# Patient Record
Sex: Male | Born: 1955 | Race: Black or African American | Hispanic: No | Marital: Married | Smoking: Never smoker
Health system: Southern US, Community
[De-identification: ages and names within clinical notes are randomized; demographics above are authoritative.]

## PROBLEM LIST (undated history)

## (undated) DIAGNOSIS — M1990 Unspecified osteoarthritis, unspecified site: Secondary | ICD-10-CM

## (undated) DIAGNOSIS — I10 Essential (primary) hypertension: Secondary | ICD-10-CM

## (undated) DIAGNOSIS — E785 Hyperlipidemia, unspecified: Secondary | ICD-10-CM

## (undated) HISTORY — DX: Hyperlipidemia, unspecified: E78.5

## (undated) HISTORY — PX: OTHER SURGICAL HISTORY: SHX169

## (undated) HISTORY — DX: Unspecified osteoarthritis, unspecified site: M19.90

---

## 1999-08-12 ENCOUNTER — Ambulatory Visit (HOSPITAL_COMMUNITY): Admission: RE | Admit: 1999-08-12 | Discharge: 1999-08-12 | Payer: Self-pay | Admitting: Emergency Medicine

## 1999-08-12 ENCOUNTER — Encounter: Payer: Self-pay | Admitting: Emergency Medicine

## 2002-05-08 ENCOUNTER — Encounter: Admission: RE | Admit: 2002-05-08 | Discharge: 2002-05-08 | Payer: Self-pay | Admitting: Emergency Medicine

## 2002-05-08 ENCOUNTER — Encounter: Payer: Self-pay | Admitting: Emergency Medicine

## 2005-11-25 ENCOUNTER — Encounter: Admission: RE | Admit: 2005-11-25 | Discharge: 2005-11-25 | Payer: Self-pay | Admitting: Emergency Medicine
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
## 2008-05-12 ENCOUNTER — Encounter: Admission: RE | Admit: 2008-05-12 | Discharge: 2008-05-12 | Payer: Self-pay | Admitting: Occupational Medicine

## 2008-05-12 IMAGING — CR DG OS CALCIS 2+V*R*
2 series · 2 of 2 positions shown · non-contrast
Comparison: Foot films done same day.

CLINICAL DATA: Right foot and heel pain.  Achilles tendonitis.

RIGHT OS CALCIS - 2+ VIEW

[view not recorded (1 of 2)]
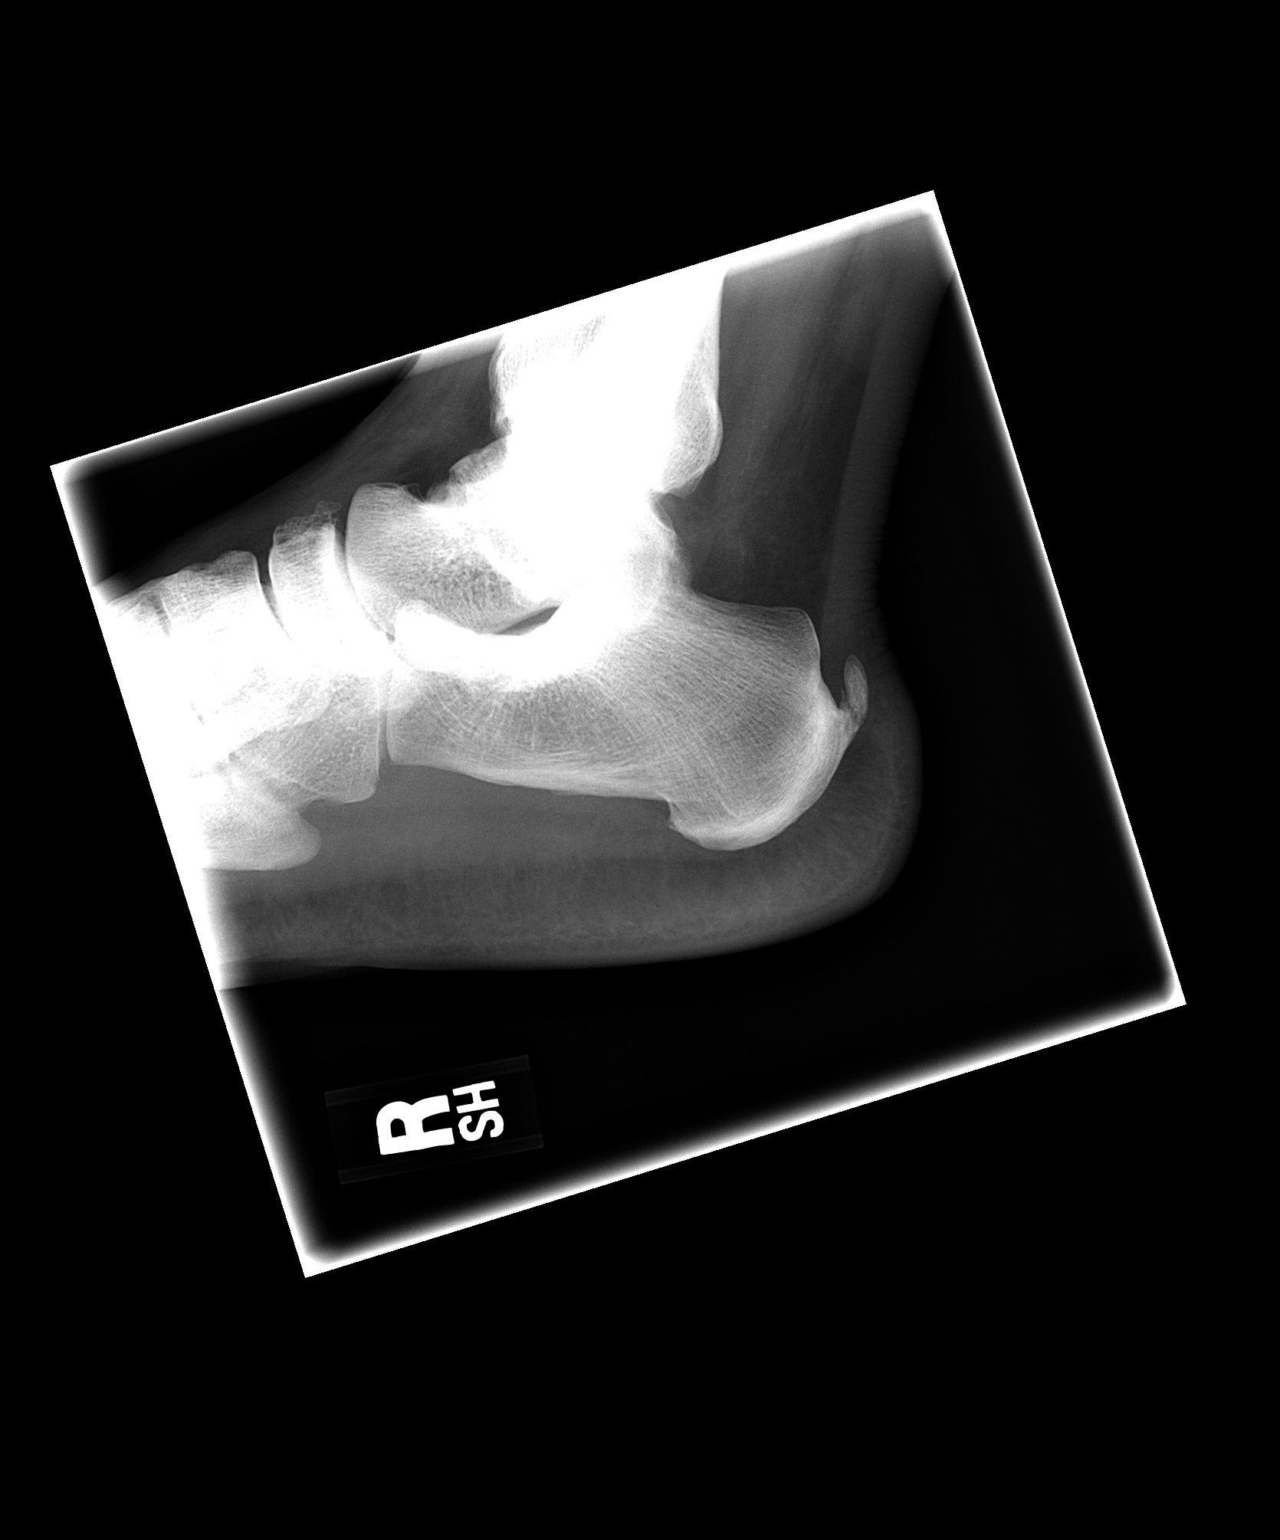

[view not recorded (2 of 2)]
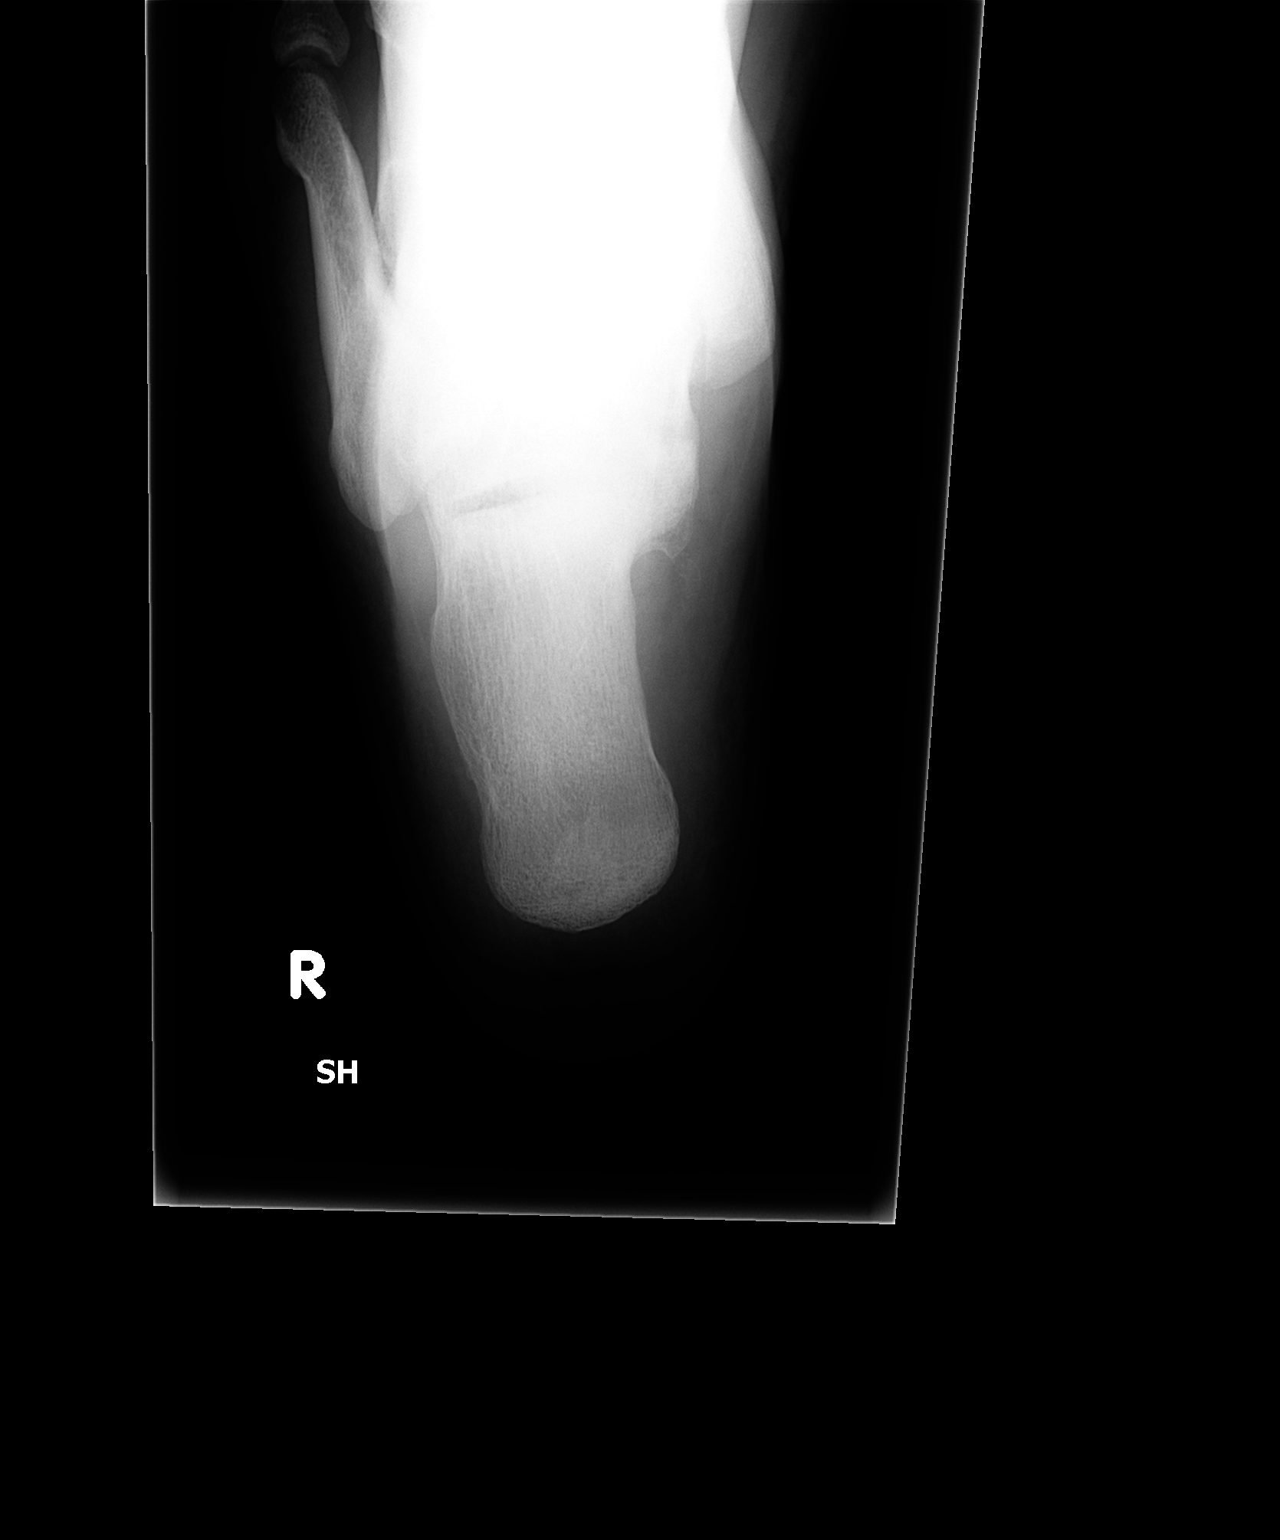

[2 of 2 positions shown; findings below may reference images not displayed]

FINDINGS: Calcaneus is intact.  No fracture is identified.  Tiny
plantar spur is present.  Moderate sized Achilles spur is present.
Faint lucency is present at the base with some very mild overlying
soft tissue swelling, but no definite fracture of enthesophyte.
Kagar fat pad appears within normal limits.
IMPRESSION: Achilles enthesopathy with subtle overlying soft tissue swelling.
Question acute enthesitis.

## 2008-05-12 IMAGING — CR DG FOOT COMPLETE 3+V*R*
3 series · 3 of 3 positions shown · non-contrast
Comparison: None available

CLINICAL DATA: Right foot pain.  Achilles tendonitis.

RIGHT FOOT COMPLETE - 3+ VIEW

[view not recorded (1 of 3)]
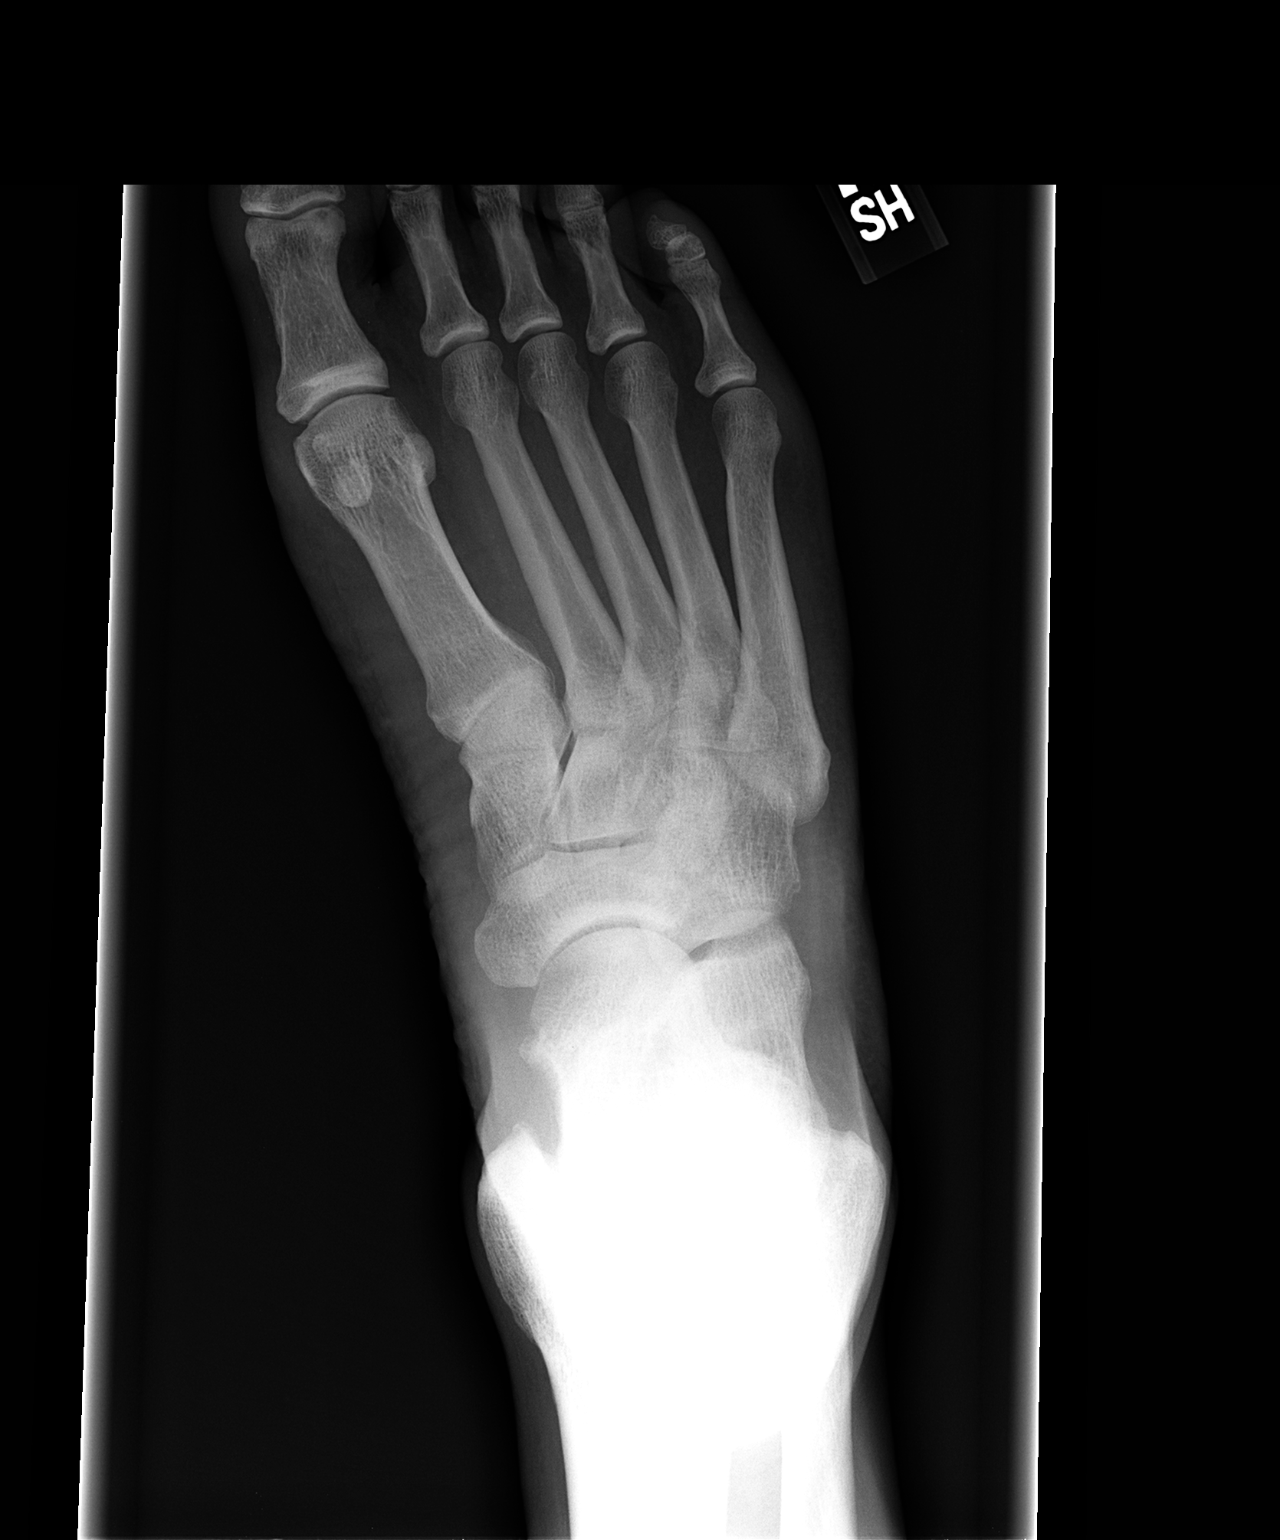

[view not recorded (2 of 3)]
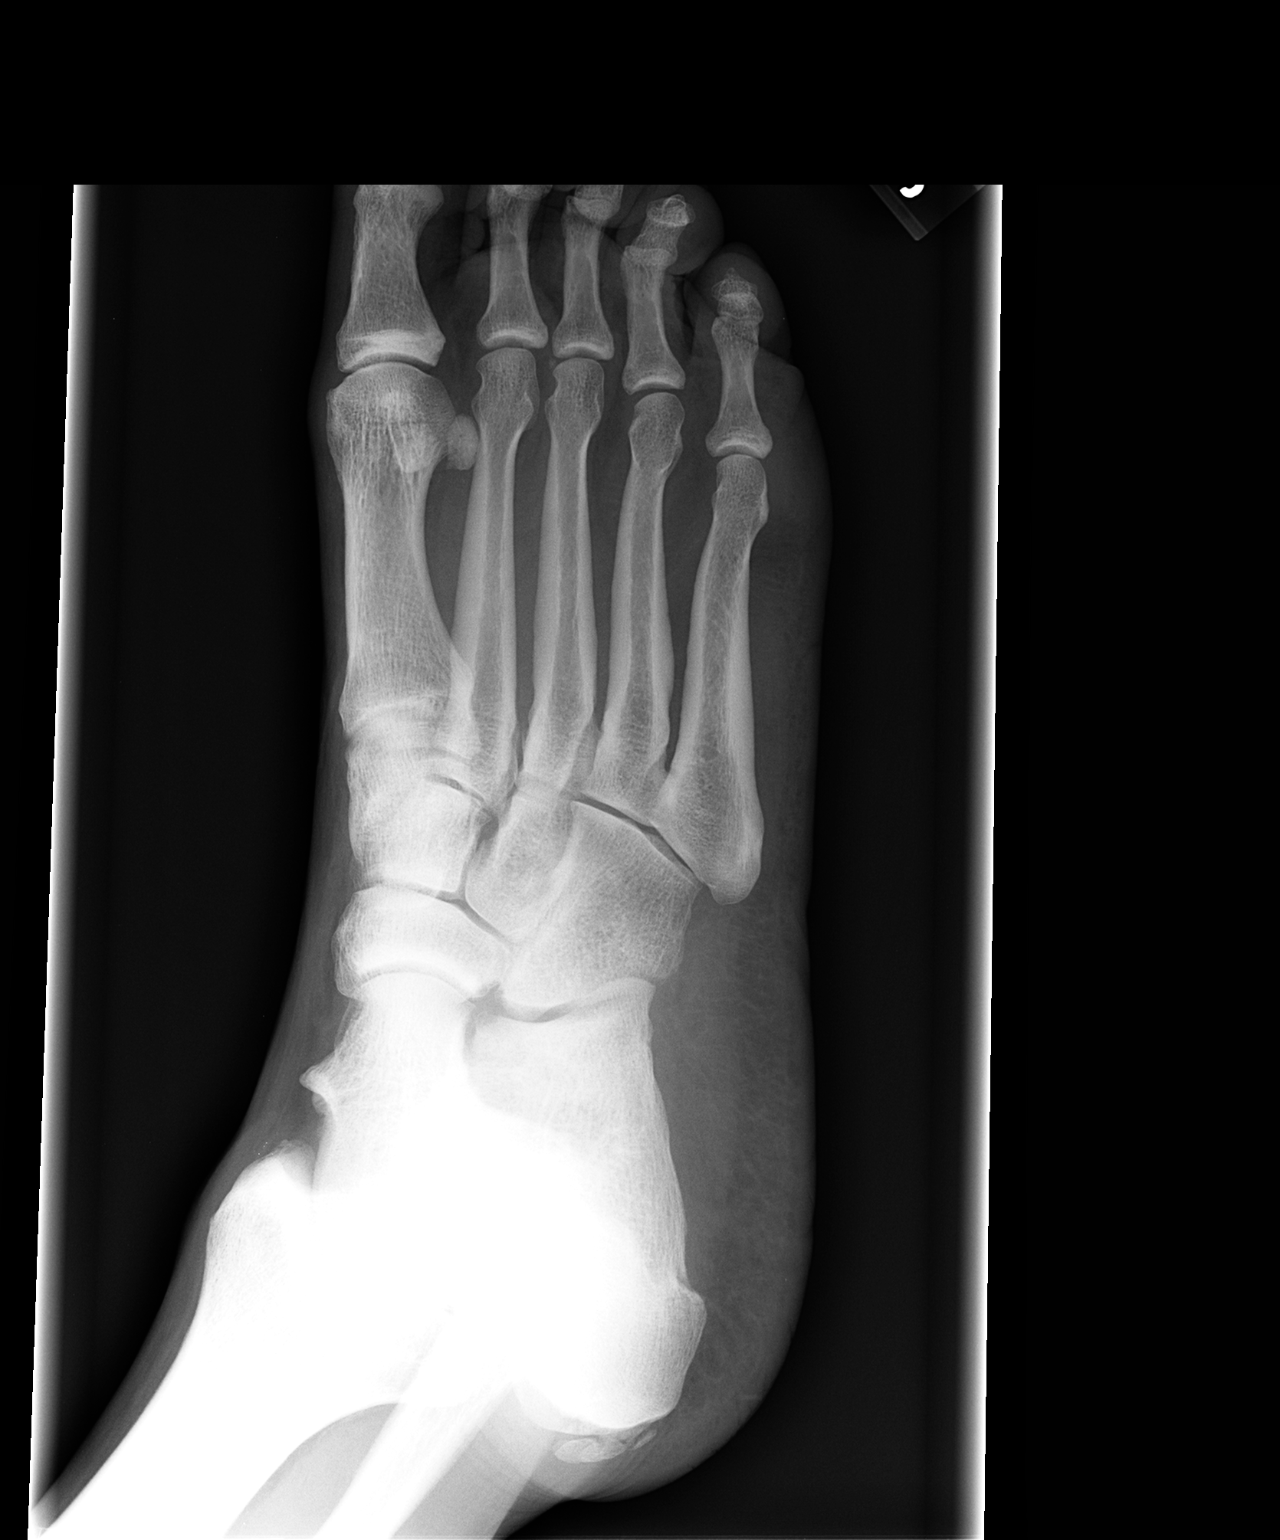

[view not recorded (3 of 3)]
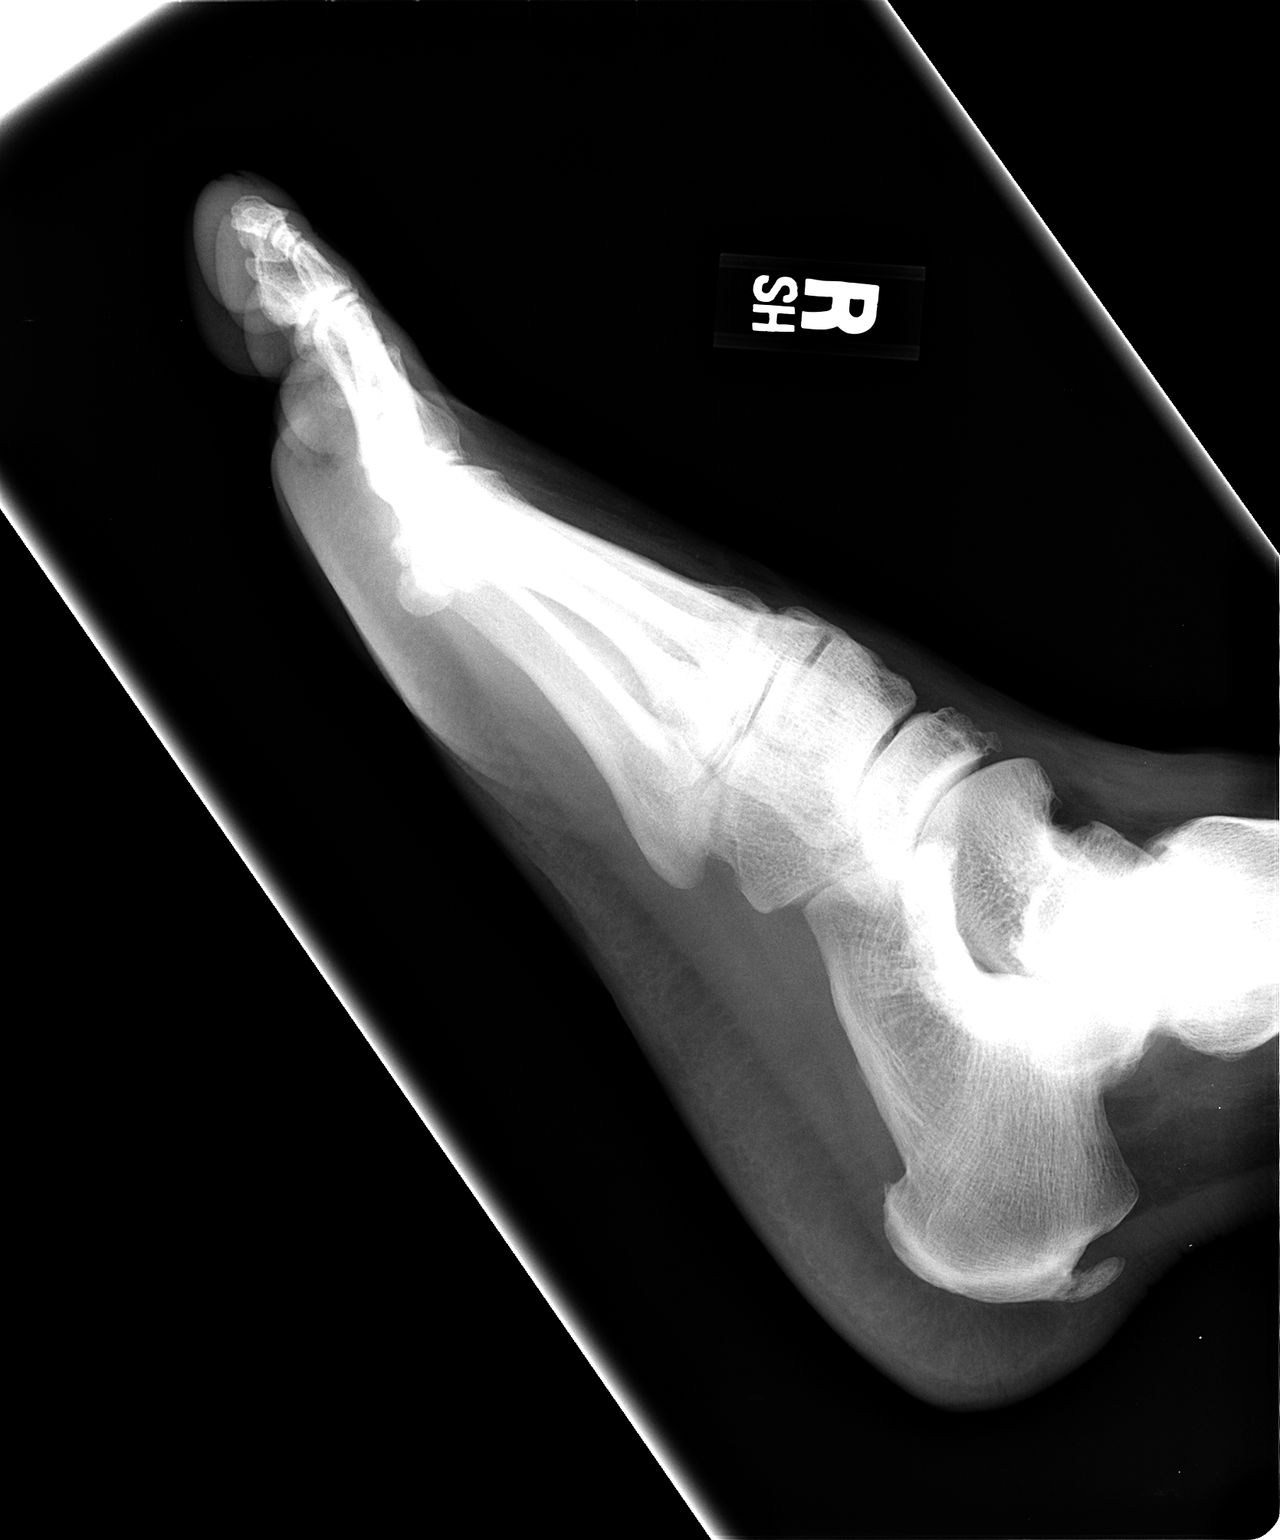

[3 of 3 positions shown; findings below may reference images not displayed]

FINDINGS: Alignment of the bones of the right foot is anatomic.
There is no fracture or dislocation identified.  No periosteal
reaction to suggest stress injury.  Soft tissues appear within
normal limits.  Large calcaneal spur.  Minimal plantar spur. Mild
first MTP joint osteoarthritis.
IMPRESSION: No acute osseous abnormality of the foot. Great toe osteoarthritis.

## 2009-03-20 DIAGNOSIS — M7750 Other enthesopathy of unspecified foot: Secondary | ICD-10-CM | POA: Insufficient documentation

## 2009-04-03 ENCOUNTER — Emergency Department (HOSPITAL_COMMUNITY): Admission: EM | Admit: 2009-04-03 | Discharge: 2009-04-03 | Payer: Self-pay | Admitting: Family Medicine

## 2009-11-23 DIAGNOSIS — R21 Rash and other nonspecific skin eruption: Secondary | ICD-10-CM | POA: Insufficient documentation

## 2010-02-24 ENCOUNTER — Encounter: Admission: RE | Admit: 2010-02-24 | Discharge: 2010-02-24 | Payer: Self-pay | Admitting: Occupational Medicine

## 2010-02-24 IMAGING — CR DG CHEST 2V
2 series · 2 of 2 positions shown · non-contrast
Comparison: None.

CLINICAL DATA: Clinical examination.

CHEST - 2 VIEW

[view not recorded (1 of 2)]
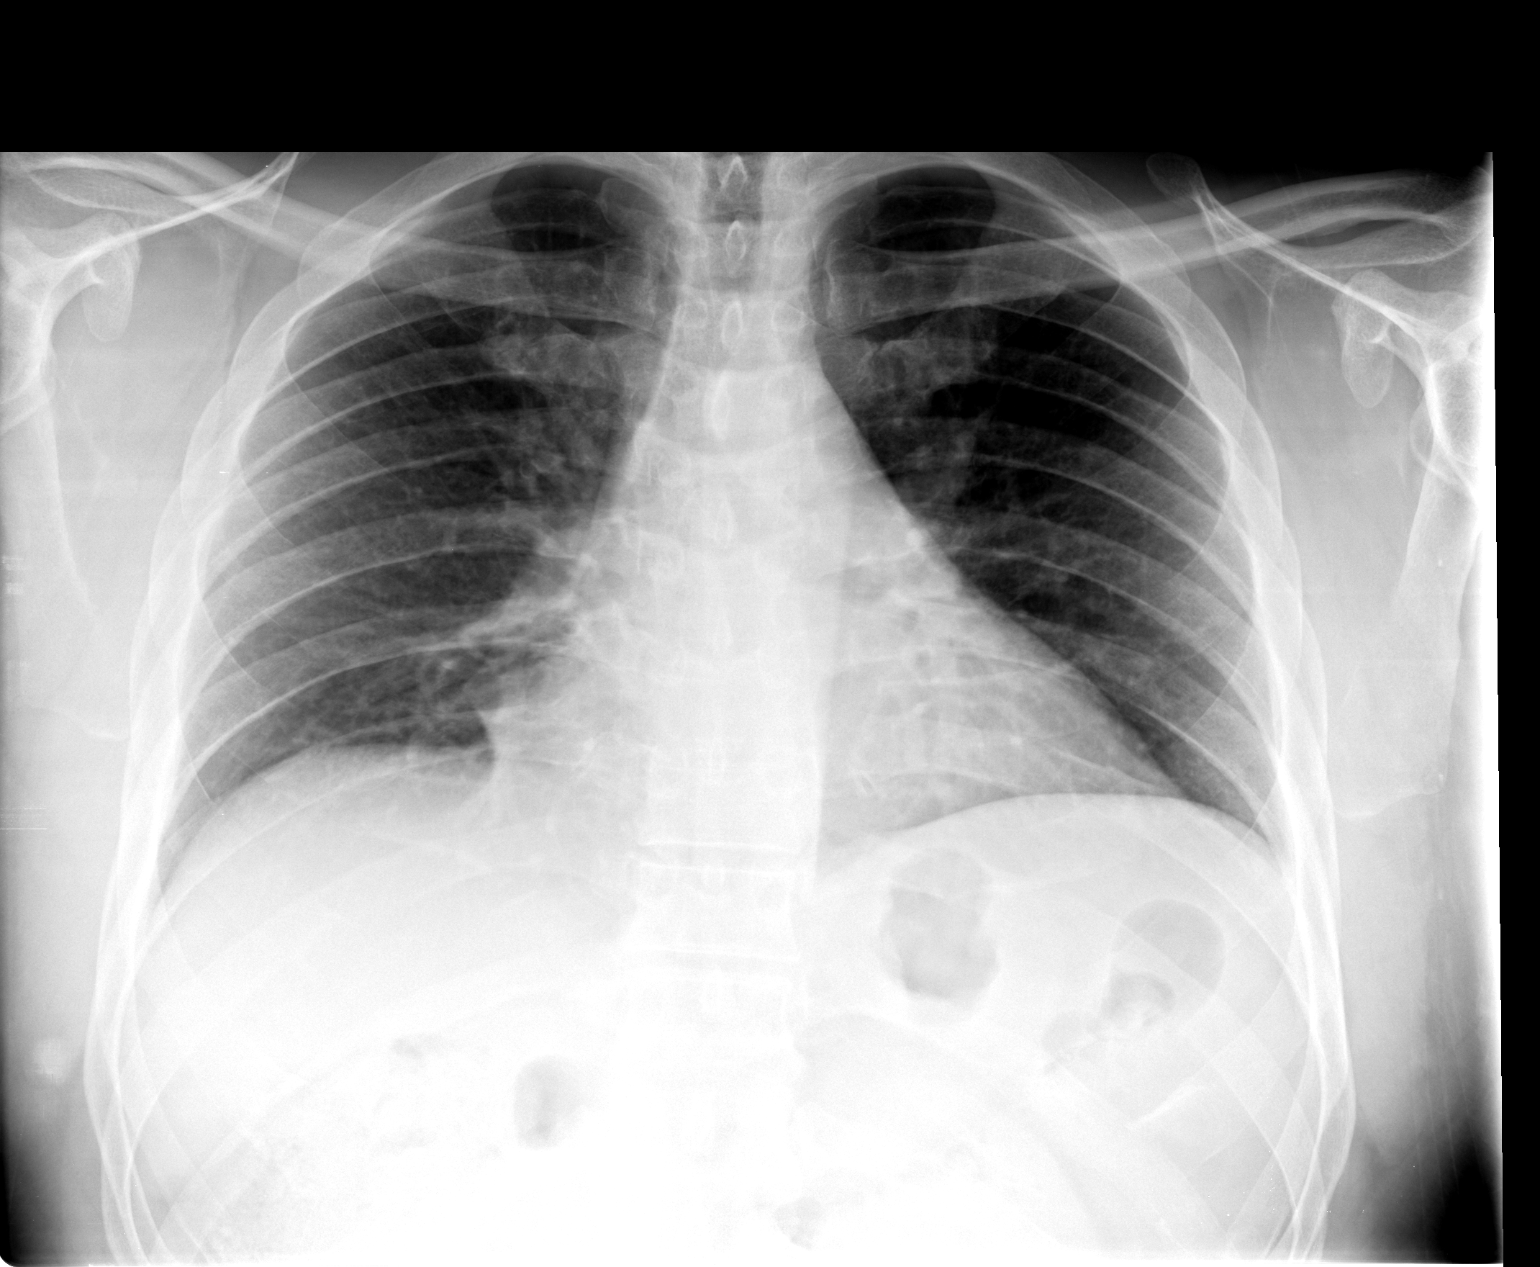

[view not recorded (2 of 2)]
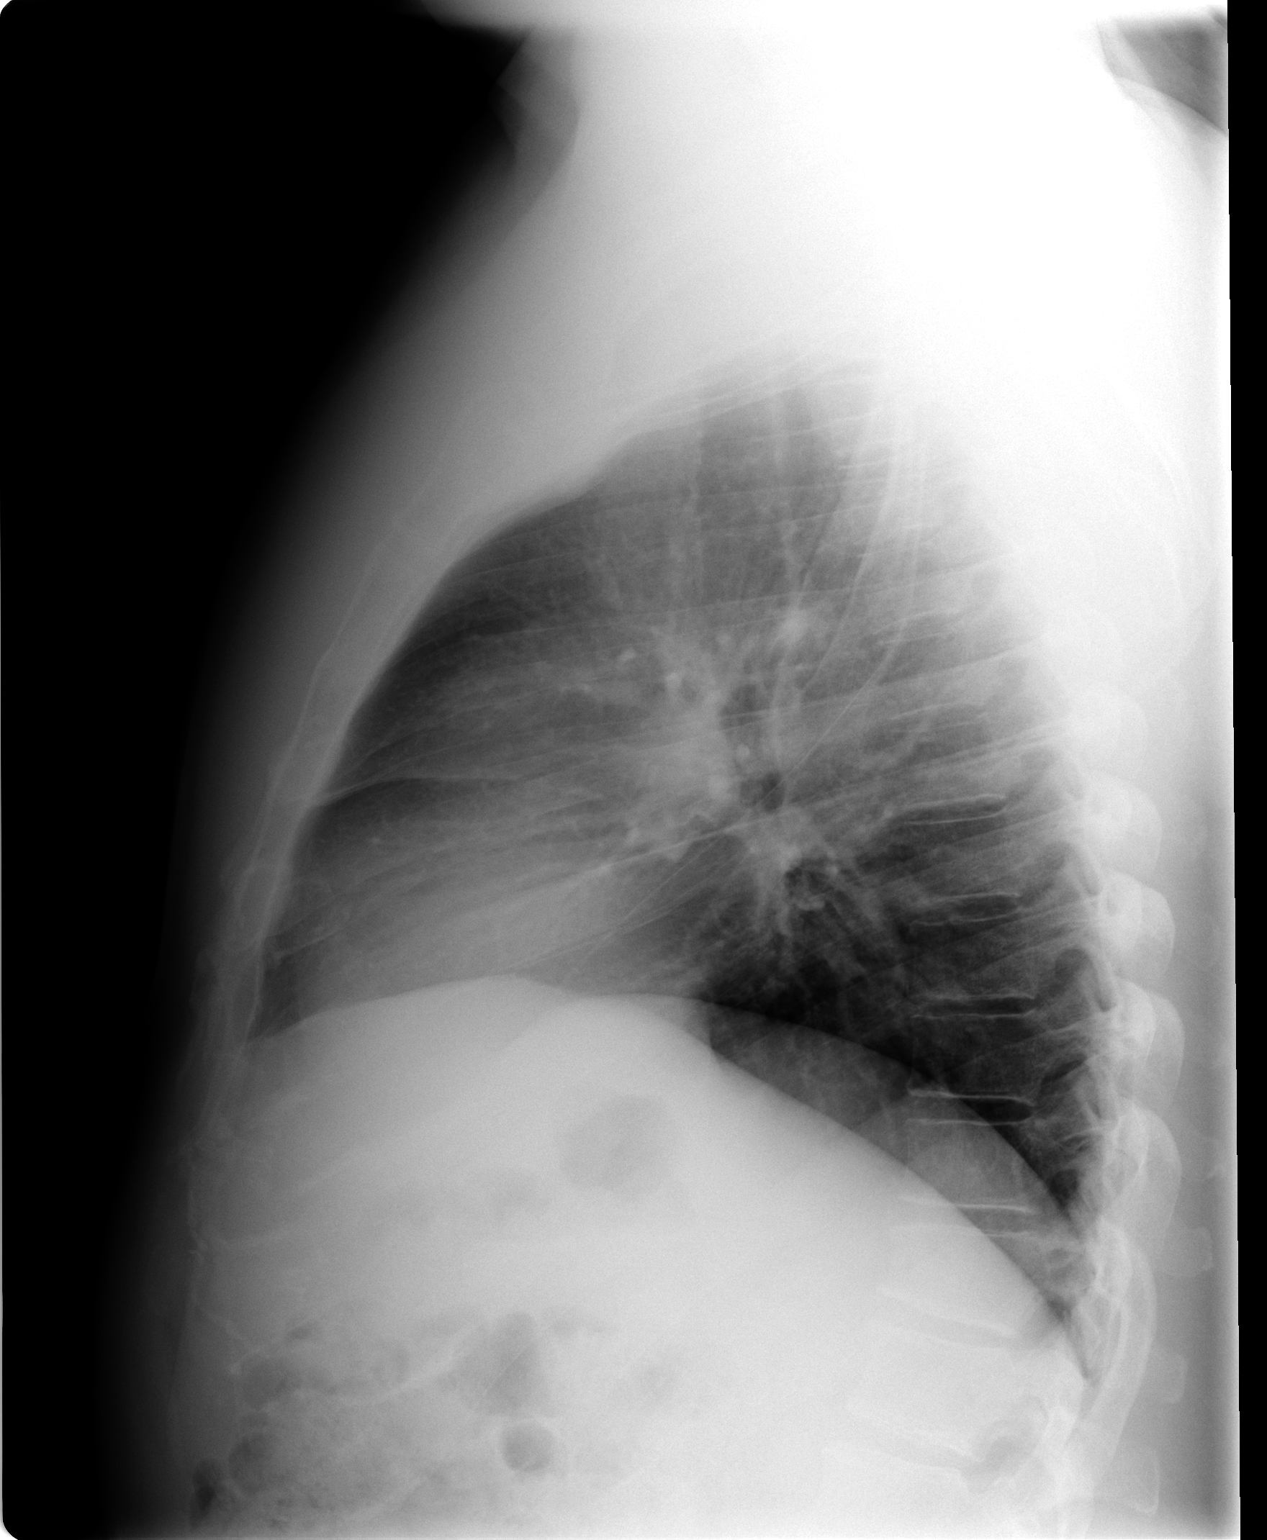

[2 of 2 positions shown; findings below may reference images not displayed]

FINDINGS: The right heart border is somewhat indistinct.  This may
be secondary to an epicardial fat pad in this region or atelectatic
changes within the anterior portion of the right middle lobe.
Correlation with any prior chest films the patient might have would
be helpful.  The left lung is clear.  Heart is normal in size.
There is no evidence for mediastinal or hilar adenopathy.
IMPRESSION: Indistinct right heart border.  This may be secondary to an
epicardial fat pad in this region.  However, atelectatic changes
within the right middle lobe could also produce this appearance.
Recommend comparison with any prior chest films the patient might
have.

## 2011-06-16 ENCOUNTER — Ambulatory Visit (INDEPENDENT_AMBULATORY_CARE_PROVIDER_SITE_OTHER): Payer: 59 | Admitting: Physician Assistant

## 2011-06-16 VITALS — BP 140/100 | HR 69 | Temp 97.8°F | Resp 18 | Ht 68.5 in | Wt 234.0 lb

## 2011-06-16 DIAGNOSIS — J069 Acute upper respiratory infection, unspecified: Secondary | ICD-10-CM

## 2011-06-16 MED ORDER — PROMETHAZINE-DM 6.25-15 MG/5ML PO SYRP: 5.0000 mL | ORAL_SOLUTION | Freq: Every day | ORAL | Status: AC

## 2011-06-16 MED ORDER — MAGIC MOUTHWASH W/LIDOCAINE: 5.0000 mL | ORAL | Status: DC | PRN

## 2011-06-16 NOTE — Progress Notes (Signed)
  Subjective:    Patient ID: Nicholas Brandt, male    DOB: 1955-08-18, 56 y.o.   MRN: [id number]  HPI Nicholas Brandt c/o uri sx x 3 d. ST, head congestion, cough.  Worried it will get worse. Not using any OTC meds  Review of Systems  HENT: Positive for congestion, sore throat and rhinorrhea. Negative for ear pain.   Respiratory: Positive for cough (dry mostly). Negative for wheezing.   Gastrointestinal: Negative for nausea, vomiting and diarrhea.  Neurological: Positive for headaches.       Objective:   Physical Exam  Constitutional: He appears well-developed and well-nourished.  HENT:  Right Ear: Tympanic membrane normal.  Left Ear: Tympanic membrane normal.  Nose: Mucosal edema present.  Mouth/Throat: Uvula is midline. Posterior oropharyngeal erythema present. No oropharyngeal exudate.  Cardiovascular: Regular rhythm.   Pulmonary/Chest: Effort normal and breath sounds normal.        Assessment & Plan:   1. URI (upper respiratory infection)    [employer] MW Phenergan DM Mucinex DM Warm salt/water gargles. Call if symptoms worsen

## 2011-06-16 NOTE — Patient Instructions (Signed)
Use Mucinex DM for cough/congestion. Use prescription mouthwash for sore throat Use cough syrup at night. This can be sedating. Call if symptoms worsen. Check your blood pressure in the near future.  If it remains > 130/85 follow up with your primary care provider   Upper Respiratory Infection, Adult An upper respiratory infection (URI) is also known as the common cold. It is often caused by a type of germ (virus). Colds are easily spread (contagious). You can pass it to others by kissing, coughing, sneezing, or drinking out of the same glass. Usually, you get better in 1 or 2 weeks.  HOME CARE   Only take medicine as told by your doctor.   Use a warm mist humidifier or breathe in steam from a hot shower.   Drink enough water and fluids to keep your pee (urine) clear or pale yellow.   Get plenty of rest.   Return to work when your temperature is back to normal or as told by your doctor. You may use a face mask and wash your hands to stop your cold from spreading.  GET HELP RIGHT AWAY IF:   After the first few days, you feel you are getting worse.   You have questions about your medicine.   You have chills, shortness of breath, or brown or red spit (mucus).   You have yellow or brown snot (nasal discharge) or pain in the face, especially when you bend forward.   You have a fever, puffy (swollen) neck, pain when you swallow, or white spots in the back of your throat.   You have a bad headache, ear pain, sinus pain, or chest pain.   You have a high-pitched whistling sound when you breathe in and out (wheezing).   You have a lasting cough or cough up blood.   You have sore muscles or a stiff neck.  MAKE SURE YOU:   Understand these instructions.   Will watch your condition.   Will get help right away if you are not doing well or get worse.  Document Released: 10/12/2007 Document Revised: 01/05/2011 Document Reviewed: 08/30/2010 [facility] Patient Information 2012 [place],  [place].

## 2013-02-25 ENCOUNTER — Ambulatory Visit (INDEPENDENT_AMBULATORY_CARE_PROVIDER_SITE_OTHER): Payer: 59

## 2013-02-25 ENCOUNTER — Ambulatory Visit (INDEPENDENT_AMBULATORY_CARE_PROVIDER_SITE_OTHER): Payer: 59 | Admitting: Podiatry

## 2013-02-25 ENCOUNTER — Encounter: Payer: Self-pay | Admitting: Podiatry

## 2013-02-25 VITALS — BP 134/87 | HR 59 | Resp 16 | Ht 69.0 in | Wt 225.0 lb

## 2013-02-25 DIAGNOSIS — M722 Plantar fascial fibromatosis: Secondary | ICD-10-CM

## 2013-02-25 DIAGNOSIS — M7660 Achilles tendinitis, unspecified leg: Secondary | ICD-10-CM

## 2013-02-25 NOTE — Progress Notes (Signed)
Subjective:     Patient ID: Nicholas Brandt, male   DOB: Mar 13, 1956, 57 y.o.   MRN: [id number]  Foot Pain   patient continues to complain of pain in his left heel other nondescript fashion. States it is worse when he is active on his foot or during times of intense activity   Review of Systems  All other systems reviewed and are negative.       Objective:   Physical Exam  Nursing note and vitals reviewed. Cardiovascular: Intact distal pulses.   Neurological: He is alert.  Skin: Skin is warm.   no swelling for previous surgery on the posterior aspect with good range of motion of the ankle joint. Mild discomfort is noted plantar within the heel and posterior but nothing acute     Assessment:     Mild plantar fasciitis Achilles tendinitis left after a long-term usage of foot    Plan:     Night splint dispensed with instructions on usage. Advised on physical therapy and continued heel lift usage

## 2013-02-25 NOTE — Progress Notes (Signed)
N-aches L-plantar heel left D-6 mos. O-gradual C-no AM pain, hurts the more he's on it, worse A-walking T-no treatment

## 2013-02-25 NOTE — Patient Instructions (Signed)
Wear 30 min to 1 hour twice a day with ice pak

## 2013-04-08 ENCOUNTER — Ambulatory Visit (INDEPENDENT_AMBULATORY_CARE_PROVIDER_SITE_OTHER): Payer: 59 | Admitting: Podiatry

## 2013-04-08 ENCOUNTER — Encounter: Payer: Self-pay | Admitting: Podiatry

## 2013-04-08 VITALS — BP 120/73 | HR 84 | Resp 16

## 2013-04-08 DIAGNOSIS — M7660 Achilles tendinitis, unspecified leg: Secondary | ICD-10-CM

## 2013-04-08 NOTE — Progress Notes (Signed)
Subjective:     Patient ID: Nicholas Brandt, male   DOB: 1955-12-29, 57 y.o.   MRN: [id number]  HPI patient presents it is feeling quite a bit better. Still gets sore after intense activity   Review of Systems     Objective:   Physical Exam Neurovascular status intact with patient's left Achilles improved with palpation    Assessment:     Patient is improving with splints and ice therapy    Plan:     Continue conservative care and discussed types of exercises I would like him to do and continued reduced activity at this time

## 2014-07-11 ENCOUNTER — Encounter: Payer: Self-pay | Admitting: Podiatrist

## 2014-07-11 ENCOUNTER — Ambulatory Visit (INDEPENDENT_AMBULATORY_CARE_PROVIDER_SITE_OTHER): Payer: 59 | Admitting: Podiatrist

## 2014-07-11 ENCOUNTER — Ambulatory Visit (INDEPENDENT_AMBULATORY_CARE_PROVIDER_SITE_OTHER): Payer: 59

## 2014-07-11 VITALS — BP 191/85 | HR 91 | Resp 16

## 2014-07-11 DIAGNOSIS — M722 Plantar fascial fibromatosis: Secondary | ICD-10-CM | POA: Diagnosis not present

## 2014-07-11 NOTE — Progress Notes (Signed)
Chief Complaint  Patient presents with  . Foot Pain    plantar heel right    "This right heel is hurting me. Hurts a lot in the morning"     HPI: Patient is 59 y.o. male who presents today for heel pain on the right foot. She relates pain with the first up in the morning or after sitting for long periods of time. He previously had an Achilles tendon repair on the left and states he is doing very well with this.   No Known Allergies  Physical Exam  Patient is awake, alert, and oriented x 3.  In no acute distress.  Vascular status is intact with palpable pedal pulses at 2/4 DP and PT bilateral and capillary refill time within normal limits. Neurological sensation is also intact bilaterally via Semmes Weinstein monofilament at 5/5 sites. Light touch, vibratory sensation, Achilles tendon reflex is intact. Dermatological exam reveals skin color, turger and texture as normal. No open lesions present.  Musculature intact with dorsiflexion, plantarflexion, inversion, eversion. On palpation plantar medial aspect of the right heel is noted with pressure and with standing. X-rays are negative for fracture or small heel spur is noted plantarly.  Assessment: plantar fasciitis right foot  Plan: Recommended a steroid injection with was carried out today without complication of Kenalog and Marcaine plain under sterile technique. Plantar fascial strapping was also applied and he was given instructions for stretching and shoe gear changes. He'll be seen back the pain does not resolve after 3 weeks.

## 2014-07-11 NOTE — Patient Instructions (Signed)

## 2014-10-02 ENCOUNTER — Ambulatory Visit (INDEPENDENT_AMBULATORY_CARE_PROVIDER_SITE_OTHER): Payer: 59 | Admitting: Family Medicine

## 2014-10-02 VITALS — BP 142/78 | HR 61 | Temp 97.6°F | Resp 17 | Ht 69.0 in | Wt 237.8 lb

## 2014-10-02 DIAGNOSIS — R05 Cough: Secondary | ICD-10-CM | POA: Diagnosis not present

## 2014-10-02 DIAGNOSIS — R058 Other specified cough: Secondary | ICD-10-CM

## 2014-10-02 DIAGNOSIS — R059 Cough, unspecified: Secondary | ICD-10-CM

## 2014-10-02 MED ORDER — PREDNISONE 20 MG PO TABS: 20.0000 mg | ORAL_TABLET | Freq: Every day | ORAL | Status: DC

## 2014-10-02 MED ORDER — HYDROCODONE-HOMATROPINE 5-1.5 MG/5ML PO SYRP: 5.0000 mL | ORAL_SOLUTION | ORAL | Status: DC | PRN

## 2014-10-02 MED ORDER — AMOXICILLIN 875 MG PO TABS: 875.0000 mg | ORAL_TABLET | Freq: Two times a day (BID) | ORAL | Status: DC

## 2014-10-02 MED ORDER — BENZONATATE 100 MG PO CAPS: 100.0000 mg | ORAL_CAPSULE | Freq: Three times a day (TID) | ORAL | Status: DC | PRN

## 2014-10-02 NOTE — Progress Notes (Signed)
  Subjective:  Patient ID: Nicholas Brandt, male    DOB: 1955/06/15  Age: 59 y.o. MRN: [id number]  59 year old man who is here with a two-week history of a cough. It started as a cough and is persisted. It for him down some. He continues to stay busy taking care of family members. He does not smoke. He does stay active with various things he doesn't live and volunteers at his church. He goes to the gym several times a week. He has continued doing that. He is not coughing up a lot of phlegm, what he does is usually clear white looking. However the symptoms have continued to persist. He does not wheeze a lot.  Past family social history reviewed   Objective:   TMs are normal. Throat clear. Neck supple without significant nodes. Chest is clear to auscultation without rhonchi, rales, wheezes. Heart regular without murmurs. Abdomen soft without mass or tenderness.  Assessment & Plan:   Assessment: Postviral cough syndrome  Plan: Patient Instructions  Drink plenty of fluids and get enough rest  Take the anabiotic amoxicillin one twice daily  Take the prednisone 3 daily for 2 days, then 2 daily for 2 days, then 1 daily for 2 days  Use the benzonatate cough pills in the daytime when needed for coughing and the cough syrup Hycodan at night. The cough syrup to make you sleepy.  Return if not improving       HOPPER,DAVID, MD 10/02/2014

## 2014-10-02 NOTE — Patient Instructions (Signed)
Drink plenty of fluids and get enough rest  Take the anabiotic amoxicillin one twice daily  Take the prednisone 3 daily for 2 days, then 2 daily for 2 days, then 1 daily for 2 days  Use the benzonatate cough pills in the daytime when needed for coughing and the cough syrup Hycodan at night. The cough syrup to make you sleepy.  Return if not improving

## 2015-10-06 ENCOUNTER — Ambulatory Visit (INDEPENDENT_AMBULATORY_CARE_PROVIDER_SITE_OTHER): Payer: Commercial Managed Care - HMO | Admitting: Family Medicine

## 2015-10-06 VITALS — BP 140/94 | HR 78 | Temp 97.8°F | Resp 15 | Ht 69.0 in | Wt 233.8 lb

## 2015-10-06 DIAGNOSIS — J069 Acute upper respiratory infection, unspecified: Secondary | ICD-10-CM | POA: Diagnosis not present

## 2015-10-06 DIAGNOSIS — R05 Cough: Secondary | ICD-10-CM

## 2015-10-06 DIAGNOSIS — R059 Cough, unspecified: Secondary | ICD-10-CM

## 2015-10-06 DIAGNOSIS — R058 Other specified cough: Secondary | ICD-10-CM

## 2015-10-06 MED ORDER — AMOXICILLIN 875 MG PO TABS: 875.0000 mg | ORAL_TABLET | Freq: Two times a day (BID) | ORAL | Status: DC

## 2015-10-06 MED ORDER — BENZONATATE 200 MG PO CAPS: 200.0000 mg | ORAL_CAPSULE | Freq: Three times a day (TID) | ORAL | Status: DC | PRN

## 2015-10-06 MED ORDER — GUAIFENESIN ER 1200 MG PO TB12
1.0000 | ORAL_TABLET | Freq: Two times a day (BID) | ORAL | Status: DC | PRN
Start: 2015-10-06 — End: 2023-02-24

## 2015-10-06 NOTE — Patient Instructions (Addendum)
IF you received an x-ray today, you will receive an invoice from [facility]. Please contact [facility] at [phone] with questions or concerns regarding your invoice.   IF you received labwork today, you will receive an invoice from [facility]. Please contact [facility] at [phone] with questions or concerns regarding your invoice.   Our billing staff will not be able to assist you with questions regarding bills from these companies.  You will be contacted with the lab results as soon as they are available. The fastest way to get your results is to activate your My Chart account. Instructions are located on the last page of this paperwork. If you have not heard from us regarding the results in 2 weeks, please contact this office.   Upper Respiratory Infection, Adult Most upper respiratory infections (URIs) are a viral infection of the air passages leading to the lungs. A URI affects the nose, throat, and upper air passages. The most common type of URI is nasopharyngitis and is typically referred to as "the common cold." URIs run their course and usually go away on their own. Most of the time, a URI does not require medical attention, but sometimes a bacterial infection in the upper airways can follow a viral infection. This is called a secondary infection. Sinus and middle ear infections are common types of secondary upper respiratory infections. Bacterial pneumonia can also complicate a URI. A URI can worsen asthma and chronic obstructive pulmonary disease (COPD). Sometimes, these complications can require emergency medical care and may be life threatening.  CAUSES Almost all URIs are caused by viruses. A virus is a type of germ and can spread from one person to another.  RISKS FACTORS You may be at risk for a URI if:   You smoke.   You have chronic heart or lung disease.  You have a weakened defense (immune) system.   You are very young  or very old.   You have nasal allergies or asthma.  You work in crowded or poorly ventilated areas.  You work in health care facilities or schools. SIGNS AND SYMPTOMS  Symptoms typically develop 2-3 days after you come in contact with a cold virus. Most viral URIs last 7-10 days. However, viral URIs from the influenza virus (flu virus) can last 14-18 days and are typically more severe. Symptoms may include:   Runny or stuffy (congested) nose.   Sneezing.   Cough.   Sore throat.   Headache.   Fatigue.   Fever.   Loss of appetite.   Pain in your forehead, behind your eyes, and over your cheekbones (sinus pain).  Muscle aches.  DIAGNOSIS  Your health care provider may diagnose a URI by:  Physical exam.  Tests to check that your symptoms are not due to another condition such as:  Strep throat.  Sinusitis.  Pneumonia.  Asthma. TREATMENT  A URI goes away on its own with time. It cannot be cured with medicines, but medicines may be prescribed or recommended to relieve symptoms. Medicines may help:  Reduce your fever.  Reduce your cough.  Relieve nasal congestion. HOME CARE INSTRUCTIONS   Take medicines only as directed by your health care provider.   Gargle warm saltwater or take cough drops to comfort your throat as directed by your health care provider.  Use a warm mist humidifier or inhale steam from a shower to increase air moisture. This may make it easier to breathe.  Drink enough fluid to keep your   urine clear or pale yellow.   Eat soups and other clear broths and maintain good nutrition.   Rest as needed.   Return to work when your temperature has returned to normal or as your health care provider advises. You may need to stay home longer to avoid infecting others. You can also use a face mask and careful hand washing to prevent spread of the virus.  Increase the usage of your inhaler if you have asthma.   Do not use any tobacco  products, including cigarettes, chewing tobacco, or electronic cigarettes. If you need help quitting, ask your health care provider. PREVENTION  The best way to protect yourself from getting a cold is to practice good hygiene.   Avoid oral or hand contact with people with cold symptoms.   Wash your hands often if contact occurs.  There is no clear evidence that vitamin C, vitamin E, echinacea, or exercise reduces the chance of developing a cold. However, it is always recommended to get plenty of rest, exercise, and practice good nutrition.  SEEK MEDICAL CARE IF:   You are getting worse rather than better.   Your symptoms are not controlled by medicine.   You have chills.  You have worsening shortness of breath.  You have brown or red mucus.  You have yellow or brown nasal discharge.  You have pain in your face, especially when you bend forward.  You have a fever.  You have swollen neck glands.  You have pain while swallowing.  You have white areas in the back of your throat. SEEK IMMEDIATE MEDICAL CARE IF:   You have severe or persistent:  Headache.  Ear pain.  Sinus pain.  Chest pain.  You have chronic lung disease and any of the following:  Wheezing.  Prolonged cough.  Coughing up blood.  A change in your usual mucus.  You have a stiff neck.  You have changes in your:  Vision.  Hearing.  Thinking.  Mood. MAKE SURE YOU:   Understand these instructions.  Will watch your condition.  Will get help right away if you are not doing well or get worse.   This information is not intended to replace advice given to you by your health care provider. Make sure you discuss any questions you have with your health care provider.   Document Released: 10/19/2000 Document Revised: 09/09/2014 Document Reviewed: 07/31/2013 [employer] Interactive Patient Education 2016 [employer]  

## 2015-10-06 NOTE — Progress Notes (Signed)
By signing my name below, I, Mesha Guinyard, attest that this documentation has been prepared under the direction and in the presence of Norberto Sorenson, MD.  Electronically Signed: Arvilla Market, Medical Scribe. 10/06/2015. 10:03 AM.  Subjective:    Patient ID: Nicholas Brandt, male    DOB: 1956-01-17, 60 y.o.   MRN: [id number]  Sore Throat  Associated symptoms include coughing. Pertinent negatives include no congestion or trouble swallowing.  Cough Associated symptoms include a sore throat. Pertinent negatives include no chills or fever.   Chief Complaint  Patient presents with  . Sore Throat  . Cough    HPI Comments: Nicholas Brandt is a 60 y.o. male who presents to the [facility] complaining of sore throat onset 3-4 days. Pt reports assicotaed symptoms productive increasing cough with clear sputum, and loss of sleep. Pt is eating regularly. Pt thinks it was triggered by environmental allergies from doing yard work. Pt reports being prone to bronchitis. Pt has had sinus problems in the past. Pt has used mucinex DM and didn't find relief. Pt doesn't like to take nay medication that makes him feel dizzy or light-headed- mucinex made him feel dizzy. Pt is not a smoker and doesn't have PMHx of asthma. Pt denies chills, fever, sinus pressure, and congestion.  There are no active problems to display for this patient.  Past Medical History  Diagnosis Date  . Hyperlipidemia    No past surgical history on file. No Known Allergies Prior to Admission medications   Medication Sig Start Date End Date Taking? Authorizing Provider  atorvastatin (LIPITOR) 20 MG tablet Take 20 mg by mouth daily.   Yes Historical Provider, MD  cyclobenzaprine (FLEXERIL) 10 MG tablet Take 10 mg by mouth 3 (three) times daily as needed for muscle spasms.   Yes Historical Provider, MD  diclofenac (VOLTAREN) 75 MG EC tablet Take 75 mg by mouth 2 (two) times daily.   Yes Historical Provider, MD    benzonatate (TESSALON) 100 MG capsule Take 1-2 capsules (100-200 mg total) by mouth 3 (three) times daily as needed. Patient not taking: Reported on 10/06/2015 10/02/14   Peyton Najjar, MD  HYDROcodone-homatropine [facility]) 5-1.5 MG/5ML syrup Take 5 mLs by mouth every 4 (four) hours as needed. Patient not taking: Reported on 10/06/2015 10/02/14   Peyton Najjar, MD  Multiple Vitamin (MULTIVITAMIN) capsule Take 1 capsule by mouth daily. Reported on 10/06/2015    Historical Provider, MD  predniSONE (DELTASONE) 20 MG tablet Take 1 tablet (20 mg total) by mouth daily with breakfast. Patient not taking: Reported on 10/06/2015 10/02/14   Peyton Najjar, MD   Social History   Social History  . Marital Status: Single    Spouse Name: N/A  . Number of Children: N/A  . Years of Education: N/A   Occupational History  . Not on file.   Social History Main Topics  . Smoking status: Never Smoker   . Smokeless tobacco: Not on file  . Alcohol Use: Not on file  . Drug Use: Not on file  . Sexual Activity: Not on file   Other Topics Concern  . Not on file   Social History Narrative   Depression screen [facility] 2/9 10/02/2014  Decreased Interest 0  Down, Depressed, Hopeless 0  PHQ - 2 Score 0    Review of Systems  Constitutional: Negative for fever and chills.  HENT: Positive for sore throat. Negative for congestion, sinus pressure and trouble swallowing.  Respiratory: Positive for cough.   Psychiatric/Behavioral: Positive for sleep disturbance.    Objective:  BP 140/94 mmHg  Pulse 78  Temp(Src) 97.8 F (36.6 C) (Oral)  Resp 15  Ht 5\' 9"  (1.753 m)  Wt 233 lb 12.8 oz (106.051 kg)  BMI 34.51 kg/m2  SpO2 98%  Physical Exam  Constitutional: He appears well-developed and well-nourished. No distress.  HENT:  Head: Normocephalic and atraumatic.  Right Ear: Tympanic membrane is erythematous and retracted.  Left Ear: Tympanic membrane normal.  Mouth/Throat: Posterior oropharyngeal erythema  present.  Deviated septum to the right with pale boggy mucus Oropharynx with erythema  No exudate  Eyes: Conjunctivae are normal.  Neck: Neck supple. No thyromegaly present.  Cardiovascular: Normal rate, regular rhythm, S1 normal, S2 normal and normal heart sounds.  Exam reveals no gallop and no friction rub.   No murmur heard. Pulmonary/Chest: Effort normal and breath sounds normal. No respiratory distress. He has no wheezes. He has no rales.  Lymphadenopathy:  No lymph adenopathy  Neurological: He is alert.  Skin: Skin is warm and dry.  Psychiatric: He has a normal mood and affect. His behavior is normal.  Nursing note and vitals reviewed.   Assessment & Plan:  Pt instructed ok to call for prednisone taper if symptoms do not significantly improve in 2-3 days. 1. Cough   2. Post-viral cough syndrome   3. Acute URI     Meds ordered this encounter  Medications  . diclofenac (VOLTAREN) 75 MG EC tablet    Sig: Take 75 mg by mouth 2 (two) times daily.  . cyclobenzaprine (FLEXERIL) 10 MG tablet    Sig: Take 10 mg by mouth 3 (three) times daily as needed for muscle spasms.  . benzonatate (TESSALON) 200 MG capsule    Sig: Take 1 capsule (200 mg total) by mouth 3 (three) times daily as needed.    Dispense:  40 capsule    Refill:  0  . amoxicillin (AMOXIL) 875 MG tablet    Sig: Take 1 tablet (875 mg total) by mouth 2 (two) times daily.    Dispense:  20 tablet    Refill:  0  . Guaifenesin (MUCINEX MAXIMUM STRENGTH) 1200 MG TB12    Sig: Take 1 tablet (1,200 mg total) by mouth every 12 (twelve) hours as needed.    Dispense:  14 tablet    Refill:  1    I personally performed the services described in this documentation, which was scribed in my presence. The recorded information has been reviewed and considered, and addended by me as needed.   Norberto SorensonEva Shaw, M.D.  [facility]  [place] [address] [place], [place] [address] [phone] phone [phone]  fax  10/12/2015 12:38 AM

## 2015-12-23 ENCOUNTER — Other Ambulatory Visit: Payer: Self-pay

## 2015-12-23 ENCOUNTER — Other Ambulatory Visit: Payer: Self-pay | Admitting: Internal Medicine

## 2015-12-23 DIAGNOSIS — Z0000 Encounter for general adult medical examination without abnormal findings: Secondary | ICD-10-CM

## 2015-12-23 NOTE — Progress Notes (Signed)
Patient getting screened to be a fecal donor for his wife

## 2015-12-24 LAB — RPR

## 2015-12-24 LAB — HEPATITIS B CORE ANTIBODY, TOTAL: HEP B C TOTAL AB: REACTIVE — AB

## 2015-12-24 LAB — HEPATITIS C ANTIBODY: HCV AB: NEGATIVE

## 2015-12-24 LAB — HEPATITIS B SURFACE ANTIBODY,QUALITATIVE: Hep B S Ab: POSITIVE — AB

## 2015-12-24 LAB — HIV ANTIBODY (ROUTINE TESTING W REFLEX): HIV: NONREACTIVE

## 2015-12-24 LAB — HEPATITIS A ANTIBODY, TOTAL: Hep A Total Ab: NONREACTIVE

## 2015-12-24 LAB — HEPATITIS B SURFACE ANTIGEN: Hepatitis B Surface Ag: NEGATIVE

## 2015-12-25 LAB — CLOSTRIDIUM DIFFICILE BY PCR: Toxigenic C. Difficile by PCR: NOT DETECTED

## 2015-12-31 LAB — STOOL CULTURE

## 2016-06-01 DIAGNOSIS — M7712 Lateral epicondylitis, left elbow: Secondary | ICD-10-CM | POA: Insufficient documentation

## 2018-10-11 ENCOUNTER — Other Ambulatory Visit: Payer: Self-pay

## 2018-10-11 DIAGNOSIS — Z20822 Contact with and (suspected) exposure to covid-19: Secondary | ICD-10-CM

## 2018-10-13 LAB — NOVEL CORONAVIRUS, NAA: SARS-CoV-2, NAA: NOT DETECTED

## 2018-11-28 DIAGNOSIS — S83242A Other tear of medial meniscus, current injury, left knee, initial encounter: Secondary | ICD-10-CM | POA: Insufficient documentation

## 2019-02-18 DIAGNOSIS — Z9889 Other specified postprocedural states: Secondary | ICD-10-CM | POA: Insufficient documentation

## 2019-07-09 ENCOUNTER — Ambulatory Visit: Payer: Self-pay | Attending: Internal Medicine

## 2019-07-09 DIAGNOSIS — Z23 Encounter for immunization: Secondary | ICD-10-CM | POA: Insufficient documentation

## 2019-07-09 NOTE — Progress Notes (Signed)
   Covid-19 Vaccination Clinic  Name:  Nicholas Brandt    MRN: [id number] DOB: 1955-07-02  07/09/2019  [name] was observed post Covid-19 immunization for 15 minutes without incident. He was provided with Vaccine Information Sheet and instruction to access the V-Safe system.   [name] was instructed to call 911 with any severe reactions post vaccine: Marland Kitchen Difficulty breathing  . Swelling of face and throat  . A fast heartbeat  . A bad rash all over body  . Dizziness and weakness   Immunizations Administered    Name Date Dose VIS Date Route   Moderna COVID-19 Vaccine 07/09/2019  1:01 PM 0.5 mL 04/09/2019 Intramuscular   Manufacturer: Moderna   Lot: [id number]   NDC: 76226-333-54

## 2019-08-06 ENCOUNTER — Ambulatory Visit: Payer: Self-pay | Attending: Internal Medicine

## 2019-08-06 DIAGNOSIS — Z23 Encounter for immunization: Secondary | ICD-10-CM

## 2019-08-06 NOTE — Progress Notes (Signed)
   Covid-19 Vaccination Clinic  Name:  BELA NYBORG    MRN: [id number] DOB: 05/26/1955  08/06/2019  Mr. Nifong was observed post Covid-19 immunization for 15 minutes without incident. He was provided with Vaccine Information Sheet and instruction to access the V-Safe system.   Mr. Mummert was instructed to call 911 with any severe reactions post vaccine: Marland Kitchen Difficulty breathing  . Swelling of face and throat  . A fast heartbeat  . A bad rash all over body  . Dizziness and weakness   Immunizations Administered    Name Date Dose VIS Date Route   Moderna COVID-19 Vaccine 08/06/2019 12:00 PM 0.5 mL 04/09/2019 Intramuscular   Manufacturer: Moderna   Lot: [id number]   NDC: 93267-124-58

## 2020-02-25 ENCOUNTER — Telehealth: Payer: Self-pay

## 2020-02-25 NOTE — Telephone Encounter (Signed)
Pt called today and stated that he was having ankle pain. I LVM for the pt to give [facility] a call back to schedule an appointment. Pt hasnt been seen since 2016.

## 2020-03-09 ENCOUNTER — Other Ambulatory Visit: Payer: Self-pay

## 2020-03-09 ENCOUNTER — Ambulatory Visit: Payer: 59 | Admitting: Podiatrist

## 2020-03-09 ENCOUNTER — Ambulatory Visit (INDEPENDENT_AMBULATORY_CARE_PROVIDER_SITE_OTHER): Payer: 59

## 2020-03-09 ENCOUNTER — Encounter: Payer: Self-pay | Admitting: Podiatrist

## 2020-03-09 ENCOUNTER — Other Ambulatory Visit: Payer: Self-pay | Admitting: Podiatrist

## 2020-03-09 DIAGNOSIS — M79672 Pain in left foot: Secondary | ICD-10-CM | POA: Diagnosis not present

## 2020-03-09 DIAGNOSIS — M779 Enthesopathy, unspecified: Secondary | ICD-10-CM

## 2020-03-09 DIAGNOSIS — M7750 Other enthesopathy of unspecified foot: Secondary | ICD-10-CM

## 2020-03-09 DIAGNOSIS — M7752 Other enthesopathy of left foot: Secondary | ICD-10-CM | POA: Diagnosis not present

## 2020-03-09 NOTE — Patient Instructions (Addendum)
Wear your walking boot (the one you got after your surgery) for the next 2-3 weeks.  Wear when up and on your foot.   Continue to ice your foot as you have been doing.

## 2020-03-09 NOTE — Progress Notes (Signed)
°  Chief Complaint  Patient presents with   Foot Pain    Left foot pain towards the on the inside of the foot. Pain has been there for about 2 and half months      HPI: Patient is 64 y.o. male who presents today for the concerns as listed above.  He states he had surgery on his Achilles tendon by Dr. Charlsie Merles over 10 years ago and has had no pain in this area until very recently.  He points to the medial aspect of the left heel and ankle as the area of tenderness.  There are no problems to display for this patient.  No Known Allergies  Review of Systems No fevers, chills, nausea, muscle aches, no difficulty breathing, no calf pain, no chest pain or shortness of breath.   Physical Exam  GENERAL APPEARANCE: Alert, conversant. Appropriately groomed. No acute distress.   VASCULAR: Pedal pulses palpable DP and PT bilateral.  Capillary refill time is immediate to all digits,  Proximal to distal cooling it warm to warm.  Digital perfusion adequate.   NEUROLOGIC: sensation is intact to 5.07 monofilament at 5/5 sites bilateral.  Light touch is intact bilateral, vibratory sensation intact bilateral  MUSCULOSKELETAL: acceptable muscle strength, tone and stability bilateral.  No gross boney pedal deformities noted.  Pain on palpation medial ankle and medial heel with pressure of palpation.   A small area of inflammation is present at the medial heel area left.  DERMATOLOGIC: skin is warm, supple, and dry.  No open lesions noted.  No rash, no pre ulcerative lesions. Digital nails are asymptomatic.    X-ray evaluation 3 views of the left foot are obtained.  Evidence of a prior Achilles tendon repair with absorbable anchors is seen on the lateral view.  A small posterior calcaneal spur is present.  No acute injury noted.  No fracture or dislocation noted.  Assessment     ICD-10-CM   1. Tendinitis of ankle or foot  M77.50   2. Foot pain, left  M79.672 DG Foot Complete Left     Plan Discussed  examination and x-ray findings.  Recommended an injection into the area of tenderness to decrease inflammation as well as wearing a air fracture walker of which he states he still has from his past surgery.  The patient agreed.  A sterile prep consisting of alcohol was performed followed by a careful injection away from the Achilles insertion on the medial ankle consisting of 4 mg dexamethasone phosphate and 0.5% Marcaine plain.  The patient tolerated this well and when he gets home he will put on his boot.  He will be seen back in 3 to 4 weeks for recheck.  Marlowe Aschoff, DPM

## 2020-03-30 ENCOUNTER — Encounter: Payer: Self-pay | Admitting: Podiatry

## 2020-03-30 ENCOUNTER — Ambulatory Visit: Payer: 59 | Admitting: Podiatry

## 2020-03-30 ENCOUNTER — Other Ambulatory Visit: Payer: Self-pay

## 2020-03-30 DIAGNOSIS — M7751 Other enthesopathy of right foot: Secondary | ICD-10-CM

## 2020-03-30 DIAGNOSIS — M7750 Other enthesopathy of unspecified foot: Secondary | ICD-10-CM

## 2020-03-30 MED ORDER — DICLOFENAC SODIUM 75 MG PO TBEC: 75.0000 mg | DELAYED_RELEASE_TABLET | Freq: Two times a day (BID) | ORAL | 2 refills | Status: DC

## 2020-03-30 MED ORDER — DEXAMETHASONE SODIUM PHOSPHATE 120 MG/30ML IJ SOLN: 4.0000 mg | Freq: Once | INTRAMUSCULAR | Status: DC

## 2020-03-30 NOTE — Progress Notes (Signed)
Subjective:   Patient ID: Nicholas Brandt, male   DOB: 64 y.o.   MRN: [id number]   HPI Patient states he is doing pretty well but he is still having an area on the side of his foot that is sore   ROS      Objective:  Physical Exam  Neurovascular status intact with discomfort in the tendon complex with inflammation fluid buildup noted     Assessment:  Continue tendinitis improved with 1 area still tender on the posterior heel     Plan:  This is an slightly different position so I went ahead I did discuss 1 injection of this spot with the continuation of boot usage and patient wants to undergo this understanding chances for rupture.  I carefully did a sterile prep and on the medial side of the Achilles right I utilized 2 mg of dexamethasone 5 mg Xylocaine and reapplied air fracture walker for complete immobilization.  Patient will be seen back 4 weeks or earlier if needed and we are desperately trying to avoid surgery on this patient

## 2020-04-27 ENCOUNTER — Ambulatory Visit: Payer: 59 | Admitting: Podiatry

## 2020-04-27 ENCOUNTER — Encounter: Payer: Self-pay | Admitting: Podiatry

## 2020-04-27 ENCOUNTER — Other Ambulatory Visit: Payer: Self-pay

## 2020-04-27 DIAGNOSIS — M7750 Other enthesopathy of unspecified foot: Secondary | ICD-10-CM

## 2020-04-27 DIAGNOSIS — M79672 Pain in left foot: Secondary | ICD-10-CM

## 2020-04-27 NOTE — Progress Notes (Signed)
Subjective:   Patient ID: Nicholas Brandt, male   DOB: 64 y.o.   MRN: [id number]   HPI Patient states feeling quite a bit better with pain still present if I do a lot of ambulation but the boot has been very helpful   ROS      Objective:  Physical Exam  Neurovascular status intact with posterior pain left improved with no equinus condition noted and good muscle strength and about 7580% improvement     Assessment:  Doing well after having Achilles tendon inflammation left lateral foot pain     Plan:  H&P reviewed condition recommended that he continue with physical therapy stretching heel lift and if symptoms persist will need to consider other treatment plan.  At this point discharge reappoint as needed

## 2020-05-21 ENCOUNTER — Other Ambulatory Visit: Payer: Self-pay

## 2020-05-21 ENCOUNTER — Ambulatory Visit (INDEPENDENT_AMBULATORY_CARE_PROVIDER_SITE_OTHER): Payer: 59

## 2020-05-21 ENCOUNTER — Ambulatory Visit: Payer: 59 | Admitting: Podiatry

## 2020-05-21 DIAGNOSIS — M84375A Stress fracture, left foot, initial encounter for fracture: Secondary | ICD-10-CM | POA: Diagnosis not present

## 2020-05-21 DIAGNOSIS — S86012D Strain of left Achilles tendon, subsequent encounter: Secondary | ICD-10-CM | POA: Diagnosis not present

## 2020-05-21 DIAGNOSIS — S9032XD Contusion of left foot, subsequent encounter: Secondary | ICD-10-CM

## 2020-05-25 NOTE — Progress Notes (Signed)
Subjective:   Patient ID: Nicholas Brandt, male   DOB: 65 y.o.   MRN: [id number]   HPI Patient presents stating he is still getting a lot of pain in the back of his left heel and he is concerned about this continued discomfort he is experiencing and its effect on his life neuro   ROS      Objective:  Physical Exam  Vascular status intact with exquisite discomfort of the posterior left heel within the bone itself and also at the insertion of the tendon into the calcaneus and slightly proximal     Assessment:  Difficult to ascertain between possibility for stress fracture versus possibility for inflammatory Achilles tendinitis condition     Plan:  H&P reviewed both conditions and at this point I recommended continued immobilization and we are sending for MRI to try to understand if he has a fracture or torn tendon.  Patient will be seen back when we get results.  X-rays were suspicious for the possibility of a stress fracture of the calcaneus

## 2020-05-27 ENCOUNTER — Emergency Department (HOSPITAL_COMMUNITY)
Admission: EM | Admit: 2020-05-27 | Discharge: 2020-05-27 | Disposition: A | Discharge status: Home / Self Care | Payer: 59 | Attending: Emergency Medicine | Admitting: Emergency Medicine

## 2020-05-27 ENCOUNTER — Emergency Department (HOSPITAL_COMMUNITY): Payer: 59

## 2020-05-27 ENCOUNTER — Encounter (HOSPITAL_COMMUNITY): Payer: Self-pay

## 2020-05-27 ENCOUNTER — Other Ambulatory Visit: Payer: Self-pay

## 2020-05-27 DIAGNOSIS — R55 Syncope and collapse: Secondary | ICD-10-CM | POA: Diagnosis not present

## 2020-05-27 DIAGNOSIS — R42 Dizziness and giddiness: Secondary | ICD-10-CM | POA: Insufficient documentation

## 2020-05-27 DIAGNOSIS — I10 Essential (primary) hypertension: Secondary | ICD-10-CM | POA: Insufficient documentation

## 2020-05-27 DIAGNOSIS — Z79899 Other long term (current) drug therapy: Secondary | ICD-10-CM | POA: Diagnosis not present

## 2020-05-27 DIAGNOSIS — M542 Cervicalgia: Secondary | ICD-10-CM | POA: Diagnosis present

## 2020-05-27 DIAGNOSIS — R4781 Slurred speech: Secondary | ICD-10-CM | POA: Insufficient documentation

## 2020-05-27 DIAGNOSIS — Z7982 Long term (current) use of aspirin: Secondary | ICD-10-CM | POA: Insufficient documentation

## 2020-05-27 HISTORY — DX: Essential (primary) hypertension: I10

## 2020-05-27 LAB — DIFFERENTIAL
Abs Immature Granulocytes: 0.03 10*3/uL (ref 0.00–0.07)
Basophils Absolute: 0 10*3/uL (ref 0.0–0.1)
Basophils Relative: 0 %
Eosinophils Absolute: 0 10*3/uL (ref 0.0–0.5)
Eosinophils Relative: 0 %
Immature Granulocytes: 0 %
Lymphocytes Relative: 13 %
Lymphs Abs: 1.2 10*3/uL (ref 0.7–4.0)
Monocytes Absolute: 0.6 10*3/uL (ref 0.1–1.0)
Monocytes Relative: 7 %
Neutro Abs: 7.3 10*3/uL (ref 1.7–7.7)
Neutrophils Relative %: 80 %

## 2020-05-27 LAB — COMPREHENSIVE METABOLIC PANEL
ALT: 24 U/L (ref 0–44)
AST: 20 U/L (ref 15–41)
Albumin: 4.2 g/dL (ref 3.5–5.0)
Alkaline Phosphatase: 73 U/L (ref 38–126)
Anion gap: 7 (ref 5–15)
BUN: 14 mg/dL (ref 8–23)
CO2: 27 mmol/L (ref 22–32)
Calcium: 9.3 mg/dL (ref 8.9–10.3)
Chloride: 103 mmol/L (ref 98–111)
Creatinine, Ser: 1.29 mg/dL — ABNORMAL HIGH (ref 0.61–1.24)
GFR, Estimated: >60 mL/min (ref >60)
Glucose, Bld: 91 mg/dL (ref 70–99)
Potassium: 4.4 mmol/L (ref 3.5–5.1)
Sodium: 137 mmol/L (ref 135–145)
Total Bilirubin: 0.2 mg/dL — ABNORMAL LOW (ref 0.3–1.2)
Total Protein: 7.3 g/dL (ref 6.5–8.1)

## 2020-05-27 LAB — URINALYSIS, ROUTINE W REFLEX MICROSCOPIC
Bilirubin Urine: NEGATIVE
Glucose, UA: NEGATIVE mg/dL
Hgb urine dipstick: NEGATIVE
Ketones, ur: NEGATIVE mg/dL
Leukocytes,Ua: NEGATIVE
Nitrite: NEGATIVE
Protein, ur: NEGATIVE mg/dL
Specific Gravity, Urine: >1.046 — ABNORMAL HIGH (ref 1.005–1.030)
pH: 5 (ref 5.0–8.0)

## 2020-05-27 LAB — CBC
HCT: 48.8 % (ref 39.0–52.0)
Hemoglobin: 15.4 g/dL (ref 13.0–17.0)
MCH: 29.1 pg (ref 26.0–34.0)
MCHC: 31.6 g/dL (ref 30.0–36.0)
MCV: 92.1 fL (ref 80.0–100.0)
Platelets: 145 10*3/uL — ABNORMAL LOW (ref 150–400)
RBC: 5.3 MIL/uL (ref 4.22–5.81)
RDW: 12.7 % (ref 11.5–15.5)
WBC: 9.2 10*3/uL (ref 4.0–10.5)
nRBC: 0 % (ref 0.0–0.2)

## 2020-05-27 LAB — RAPID URINE DRUG SCREEN, HOSP PERFORMED
Amphetamines: NOT DETECTED
Barbiturates: NOT DETECTED
Benzodiazepines: NOT DETECTED
Cocaine: NOT DETECTED
Opiates: NOT DETECTED
Tetrahydrocannabinol: NOT DETECTED

## 2020-05-27 LAB — CBG MONITORING, ED: Glucose-Capillary: 86 mg/dL (ref 70–99)

## 2020-05-27 LAB — ETHANOL: Alcohol, Ethyl (B): <10 mg/dL (ref <10)

## 2020-05-27 LAB — APTT: aPTT: 29 seconds (ref 24–36)

## 2020-05-27 LAB — PROTIME-INR
INR: 1.1 (ref 0.8–1.2)
Prothrombin Time: 13.4 seconds (ref 11.4–15.2)

## 2020-05-27 IMAGING — MR MR CERVICAL SPINE W/O CM
5 series · 38 of 48 positions shown · non-contrast
Comparison: CTA [DATE]

CLINICAL DATA: Neck pain.

EXAM:
MRI CERVICAL SPINE WITHOUT CONTRAST
TECHNIQUE: Multiplanar, multisequence MR imaging of the cervical spine was
performed. No intravenous contrast was administered.

[Series 9: T2 · sagittal · 3.0mm · 0.69mm/px · 6 of 15 slices shown (1 of 2)]
[im 1/15]
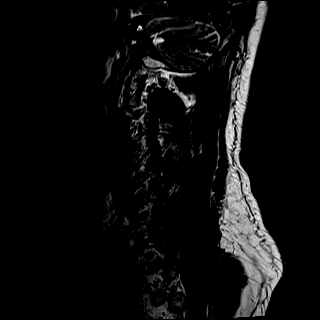
[im 3/15]
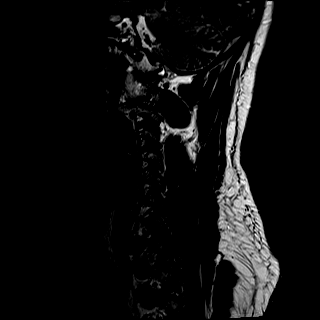
[im 6/15]
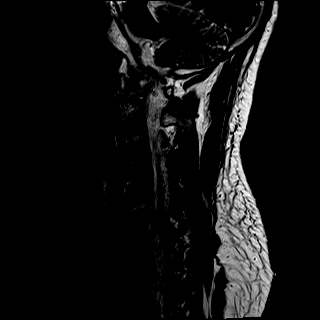
[im 9/15]
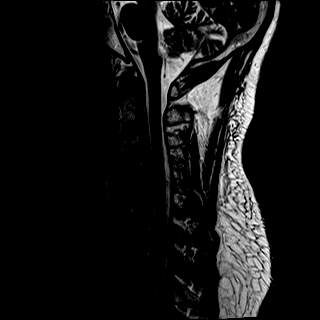
[im 12/15]
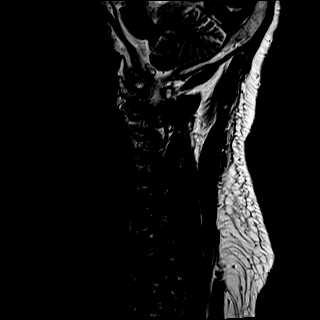
[im 15/15]
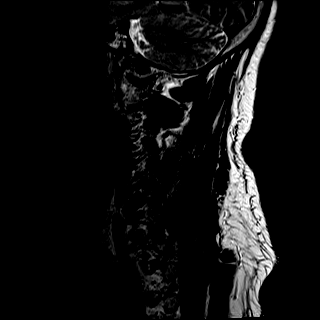

[Series 10: T1 · sagittal · 3.0mm · 0.86mm/px · 6 of 15 slices shown]
[im 1/15]
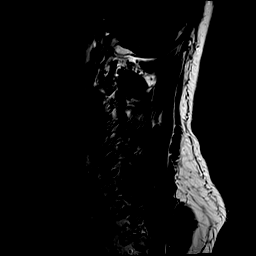
[im 3/15]
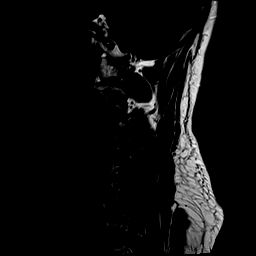
[im 6/15]
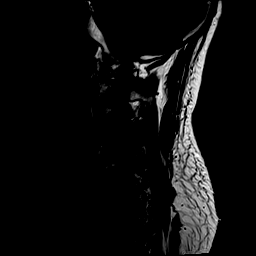
[im 9/15]
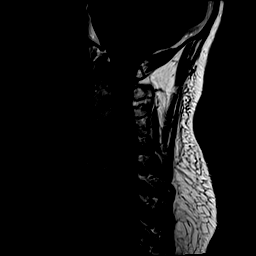
[im 12/15]
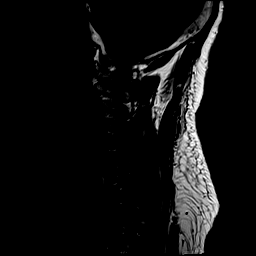
[im 15/15]
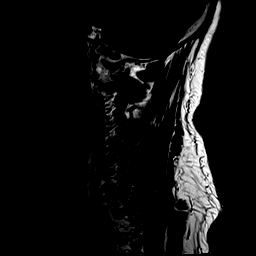

[Series 11: STIR · sagittal · 3.0mm · 0.69mm/px · 6 of 15 slices shown]
[im 1/15]
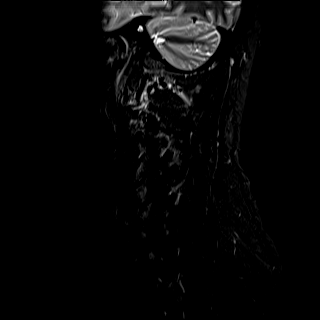
[im 3/15]
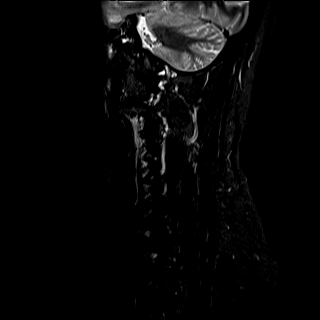
[im 6/15]
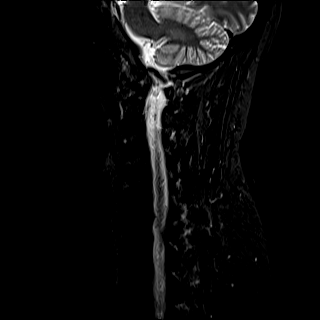
[im 9/15]
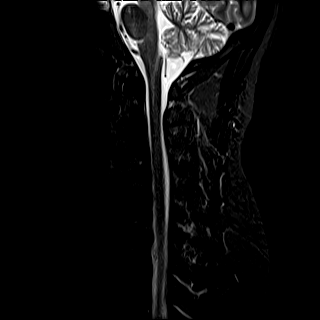
[im 12/15]
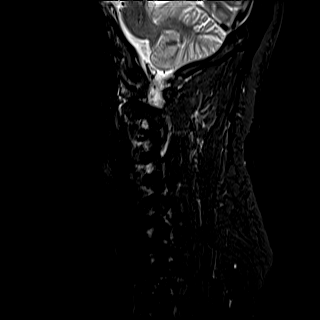
[im 15/15]
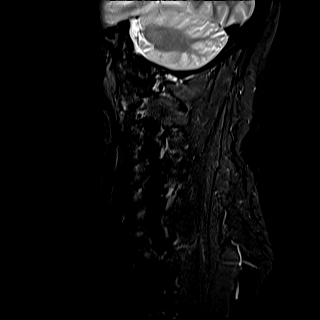

[Series 12: T2 · axial · 3.0mm · 0.70mm/px · z∈[-204,-94]mm · 12 of 36 slices shown (2 of 2)]
[im 1/36]
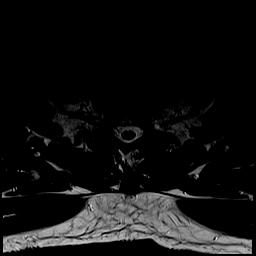
[im 3/36]
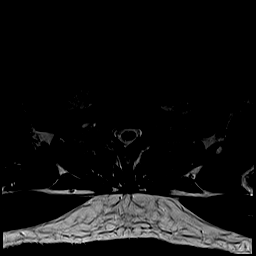
[im 6/36]
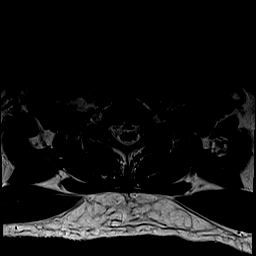
[im 8/36]
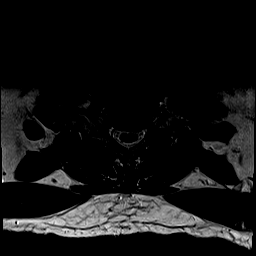
[im 11/36]
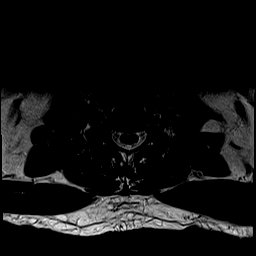
[im 13/36]
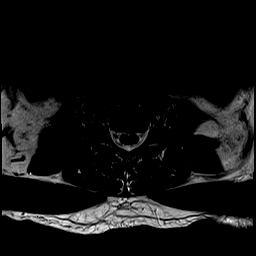
[im 16/36]
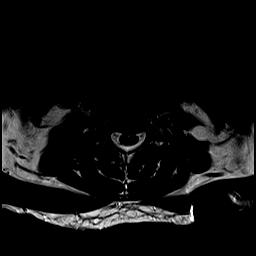
[im 18/36]
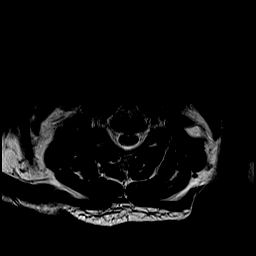
[im 21/36]
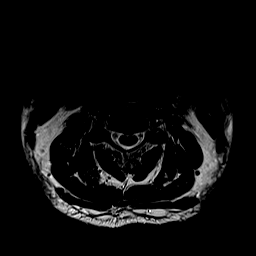
[im 26/36]
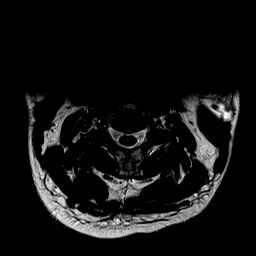
[im 31/36]
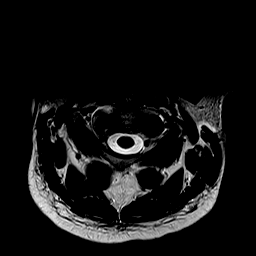
[im 36/36]
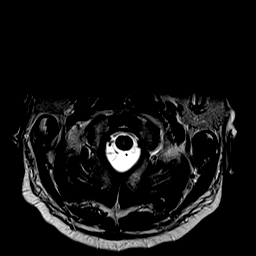

[Series 13: GRE · axial · 3.0mm · 0.35mm/px · z∈[-204,-94]mm · 8 of 36 slices shown]
[im 1/36]
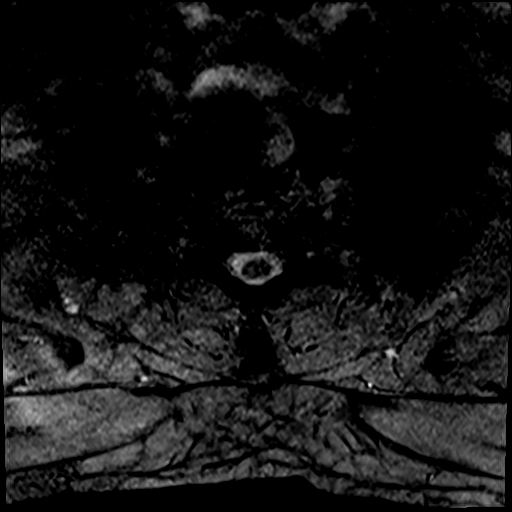
[im 6/36]
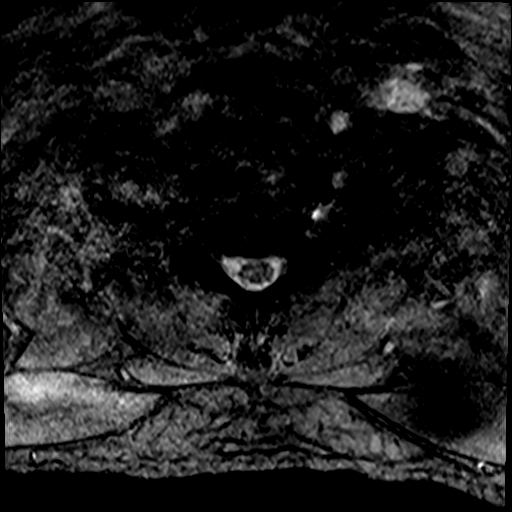
[im 11/36]
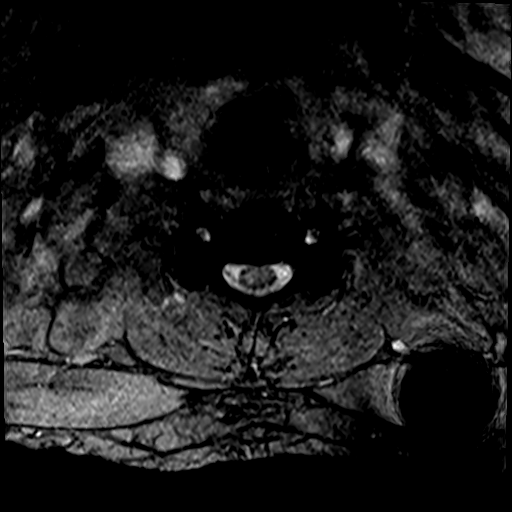
[im 16/36]
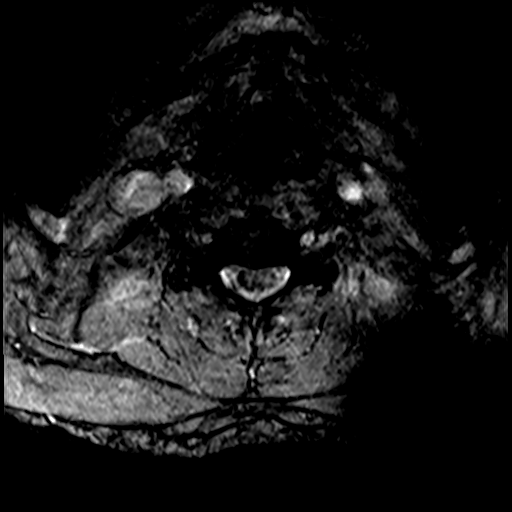
[im 21/36]
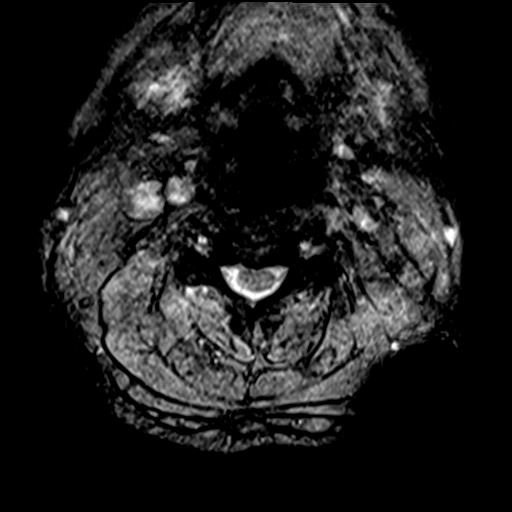
[im 26/36]
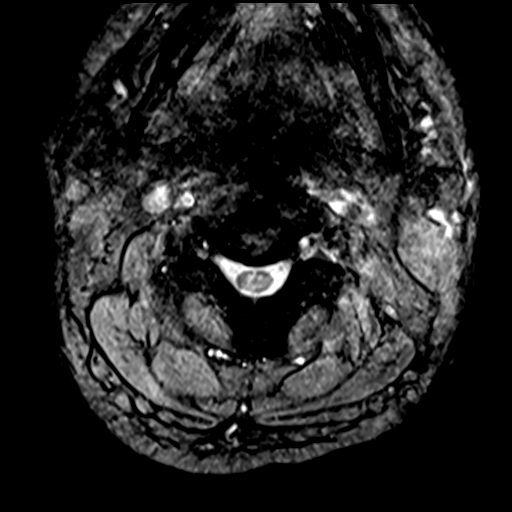
[im 31/36]
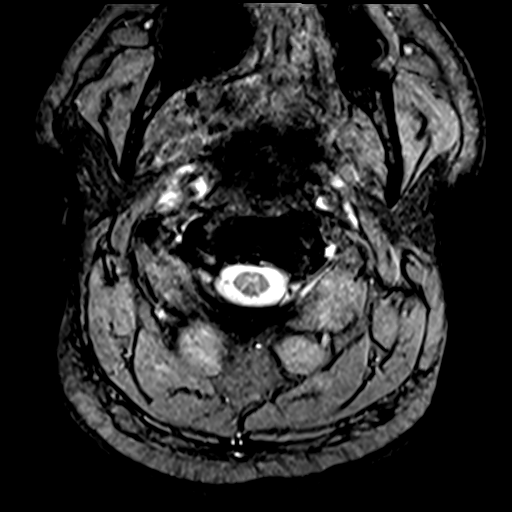
[im 36/36]
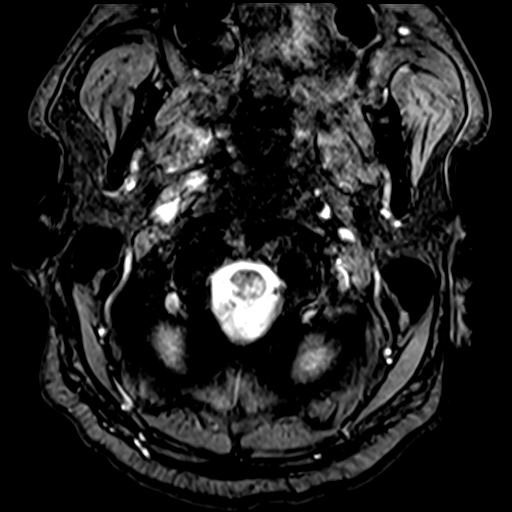

[38 of 48 positions shown; findings below may reference images not displayed]

FINDINGS: Alignment: Straightening and slight reversal of the cervical
lordosis without static listhesis.

Vertebrae: No fracture, evidence of discitis, or bone lesion. Mild
bone edema within left pedicle and articular process of C3 and
within the right articular process C4 (series 11, images 3 and 12).
No corresponding fracture line is evident.

Cord: Normal signal and morphology.

Posterior Fossa, vertebral arteries, paraspinal tissues: Vertebral
artery flow voids are identified bilaterally. No soft tissue fluid
collection or hematoma. Please see dedicated MRI of the brain for
characterization of the posterior fossa.

Disc levels:

C2-C3: No significant disc protrusion. Mild bilateral facet
arthropathy. No foraminal or canal stenosis.

C3-C4: Minimal disc osteophyte complex with small central disc
protrusion. Mild bilateral facet arthropathy. No foraminal or canal
stenosis.

C4-C5: Minimal disc osteophyte complex and minimal bilateral facet
arthropathy. No foraminal or canal stenosis.

C5-C6: Small posterior disc osteophyte complex with mild bilateral
facet arthropathy and left-sided uncovertebral spurring. Findings
result in mild left foraminal stenosis without canal stenosis.

C6-C7: Small posterior disc osteophyte complex, slightly eccentric
to the left. No foraminal or canal stenosis.

C7-T1: Small right paracentral disc protrusion without foraminal or
canal stenosis.
IMPRESSION: 1. Mild bone edema within the posterior elements on the left at C3
and on the right at C4. No corresponding fracture line is evident.
Findings may reflect bone contusions/stress change or be reactive to
mild facet arthropathy.
2. Mild multilevel degenerative changes of the cervical spine with
mild left foraminal stenosis at C5-6. No canal stenosis at any
level.

## 2020-05-27 IMAGING — MR MR HEAD W/O CM
11 of 12 series · 36 of 48 positions shown · non-contrast
Comparison: Head CT [DATE]

CLINICAL DATA: Transient ischemic attack.

EXAM:
MRI HEAD WITHOUT CONTRAST
TECHNIQUE: Multiplanar, multiecho pulse sequences of the brain and surrounding
structures were obtained without intravenous contrast.

[Series 5: DWI · axial · 3.0mm · 0.77mm/px · z∈[-95,+49]mm · 4 of 50 slices shown (1 of 4)]
[im 1/50]
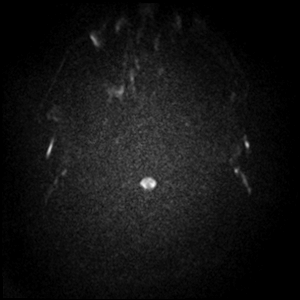
[im 17/50]
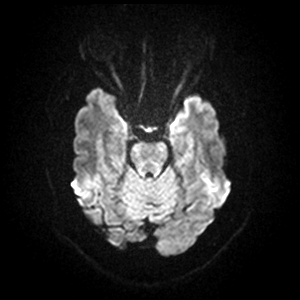
[im 33/50]
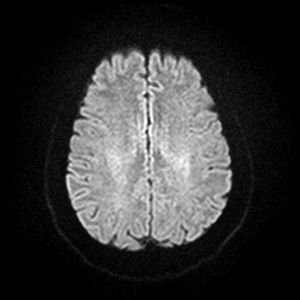
[im 50/50]
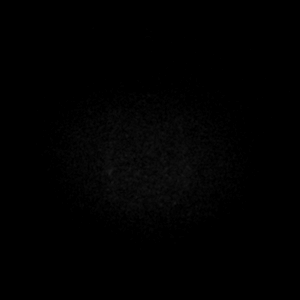

[Series 6: DWI · axial · 3.0mm · 0.77mm/px · z∈[-95,+49]mm · 4 of 50 slices shown (2 of 4)]
[im 1/50]
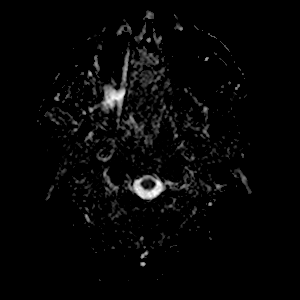
[im 17/50]
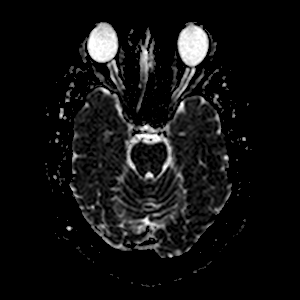
[im 33/50]
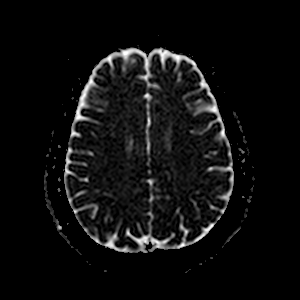
[im 50/50]
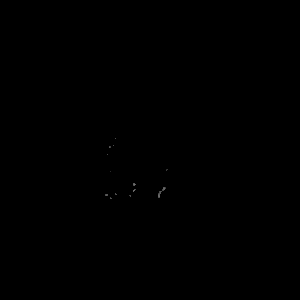

[Series 7: DWI · coronal · 5.0mm · 0.88mm/px · 3 of 30 slices shown (3 of 4)]
[im 1/30]
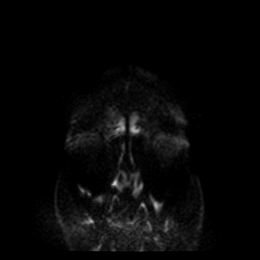
[im 15/30]
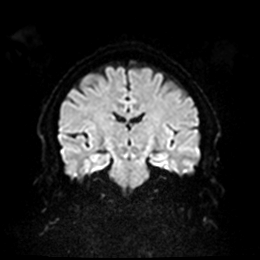
[im 30/30]
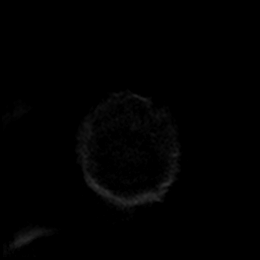

[Series 8: DWI · coronal · 5.0mm · 0.88mm/px · 3 of 30 slices shown (4 of 4)]
[im 1/30]
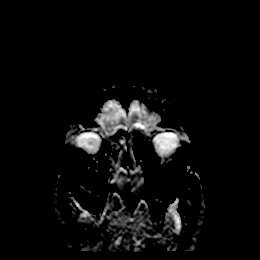
[im 15/30]
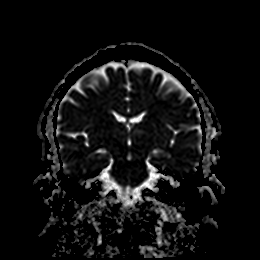
[im 30/30]
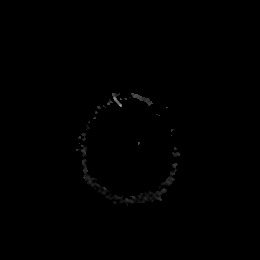

[Series 9: T1 · sagittal · 5.0mm · 0.78mm/px · 2 of 21 slices shown]
[im 1/21]
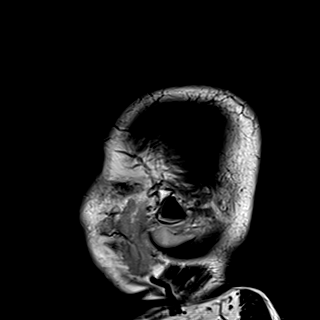
[im 21/21]
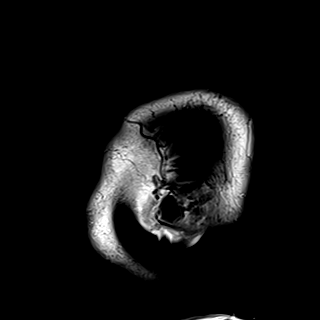

[Series 10: T2 · axial · 5.0mm · 0.72mm/px · z∈[-95,+49]mm · 2 of 22 slices shown (1 of 2)]
[im 1/22]
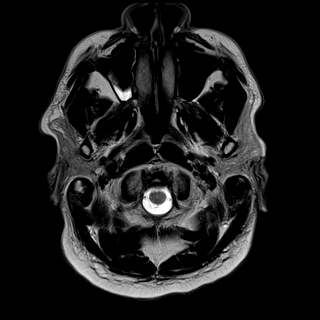
[im 22/22]
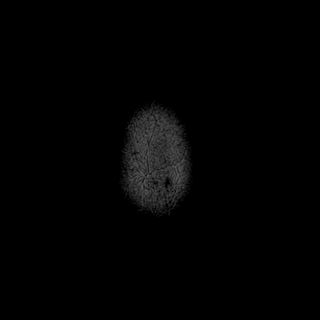

[Series 11: mag_images · axial · 3.0mm · 0.90mm/px · z∈[-95,+55]mm · 4 of 52 slices shown]
[im 1/52]
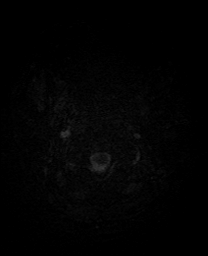
[im 18/52]
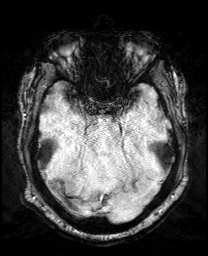
[im 35/52]
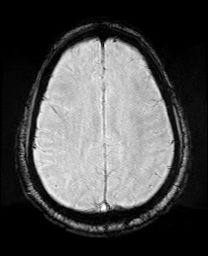
[im 52/52]
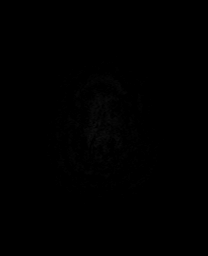

[Series 12: pha_images · axial · 3.0mm · 0.90mm/px · z∈[-95,+55]mm · 4 of 52 slices shown]
[im 1/52]
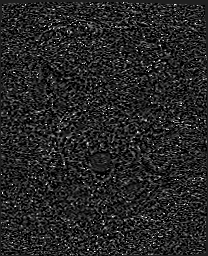
[im 18/52]
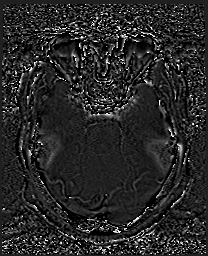
[im 35/52]
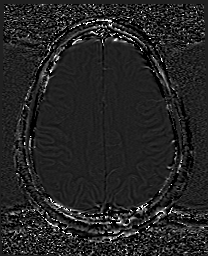
[im 52/52]
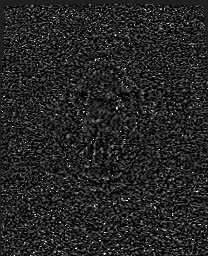

[Series 13: swi_images · axial · 3.0mm · 0.90mm/px · z∈[-95,+55]mm · 4 of 52 slices shown]
[im 1/52]
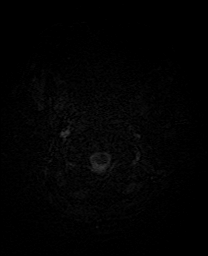
[im 18/52]
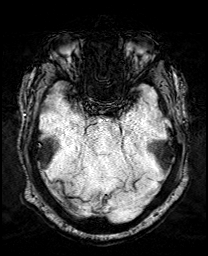
[im 35/52]
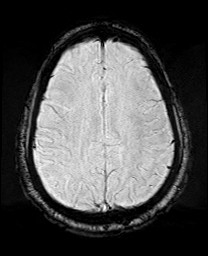
[im 52/52]
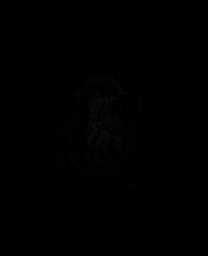

[Series 15: FLAIR · axial · 4.0mm · 0.43mm/px · z∈[-92,+53]mm · 3 of 38 slices shown]
[im 1/38]
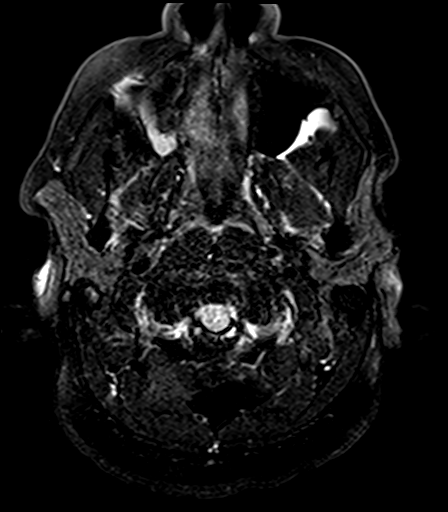
[im 19/38]
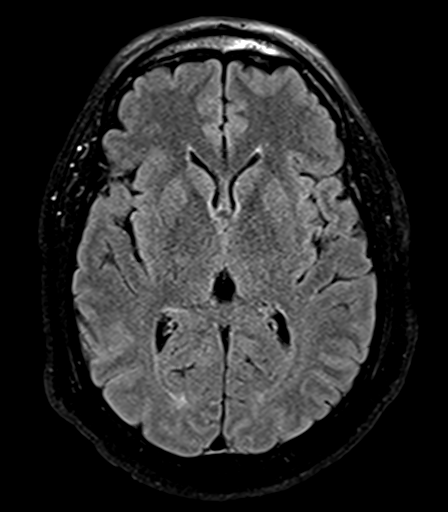
[im 38/38]
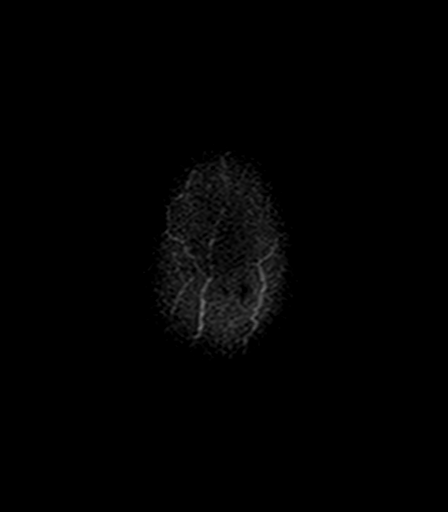

[Series 17: T2 · coronal · 5.0mm · 0.80mm/px · 3 of 30 slices shown (2 of 2)]
[im 1/30]
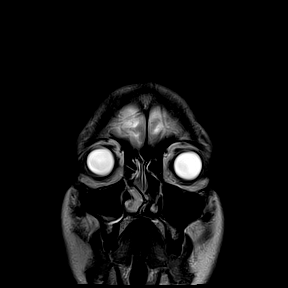
[im 15/30]
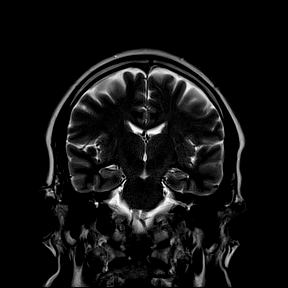
[im 30/30]
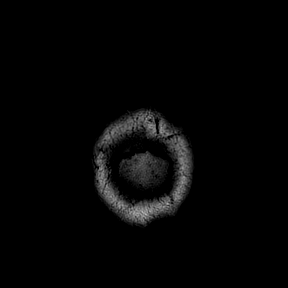

[36 of 48 positions shown; findings below may reference images not displayed]

FINDINGS: Brain: No acute infarction, hemorrhage, hydrocephalus, extra-axial
collection or mass lesion. Rare punctate foci of T2 hyperintense are
seen within the white matter of the cerebral hemispheres,
nonspecific.

Vascular: Normal flow voids.

Skull and upper cervical spine: Normal marrow signal.

Sinuses/Orbits: Mild mucosal thickening of the right maxillary
sinus. The orbits are maintained.
IMPRESSION: 1. No acute intracranial abnormality.
2. Rare punctate foci of T2 hyperintense within the white matter of
the cerebral hemispheres are nonspecific but may be seen in the
setting of early chronic microvascular ischemic disease.

## 2020-05-27 IMAGING — DX DG CHEST 1V PORT
1 series · 1 of 1 positions shown · non-contrast
Comparison: [DATE]

CLINICAL DATA: Neck pain, sweating and stuttering with slurred
speech after a chiropractor visit

EXAM:
PORTABLE CHEST 1 VIEW

[chest ap]
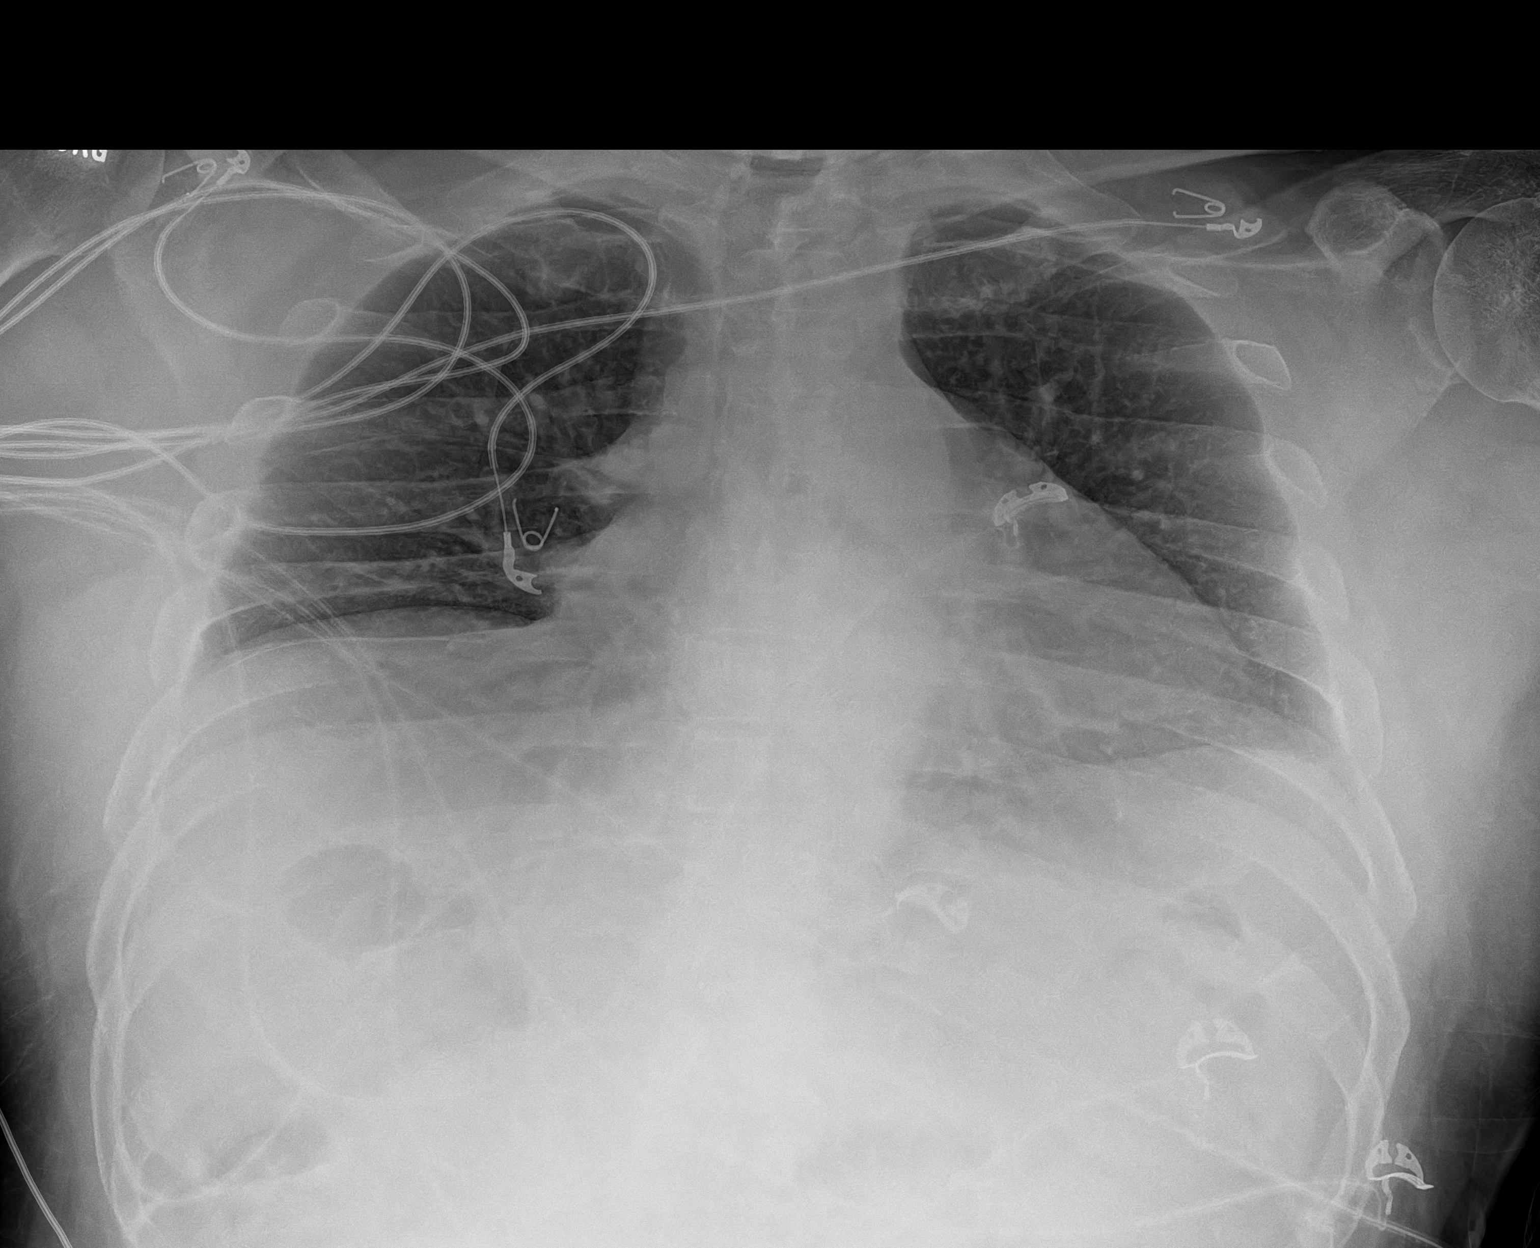

[1 of 1 positions shown; findings below may reference images not displayed]

FINDINGS: Chronic indistinctness of the right heart border likely due to
scarring or epicardial adipose tissue. This is unchanged from [QQ].

Low lung volumes are present, causing crowding of the pulmonary
vasculature. Borderline enlargement of the cardiopericardial
silhouette, without edema. Borderline elevation of the right
hemidiaphragm.

The projection is mildly apical lordotic.
IMPRESSION: 1. Low lung volumes.
2. Borderline enlargement of the cardiopericardial silhouette,
without edema.
3. Chronic indistinctness of the right heart border (no change from
[QQ]) likely due to scarring or epicardial adipose tissue.

## 2020-05-27 IMAGING — CT CT HEAD CODE STROKE
3 series · 15 of 47 positions shown, 18 images · non-contrast
Comparison: None.

CLINICAL DATA: Code stroke.

EXAM:
CT HEAD WITHOUT CONTRAST
TECHNIQUE: Contiguous axial images were obtained from the base of the skull
through the vertex without intravenous contrast.

[Series 2: head w o · axial · 0.45mm/px · z∈[+1732,+1857]mm · 9 of 31 slices shown, 12 images]
[im 3/31  brain]
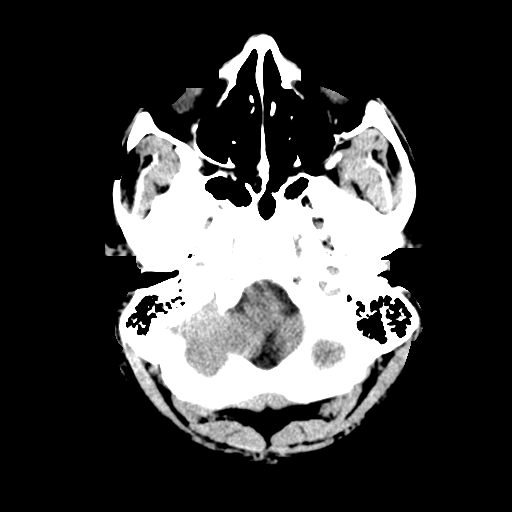
[im 3/31  bone]
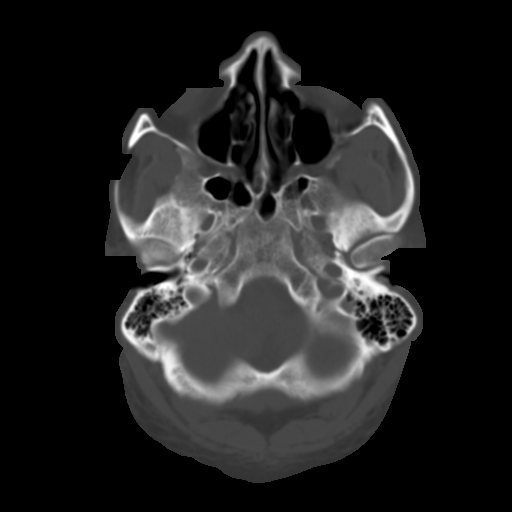
[im 6/31  brain]
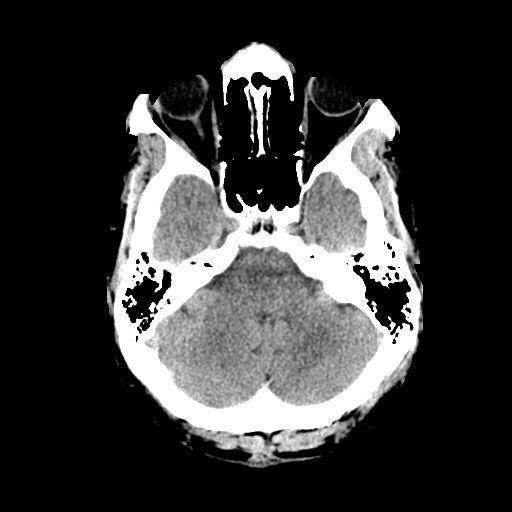
[im 9/31  brain]
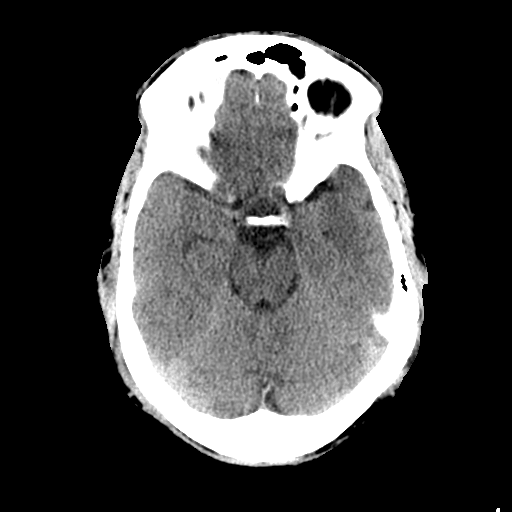
[im 12/31  brain]
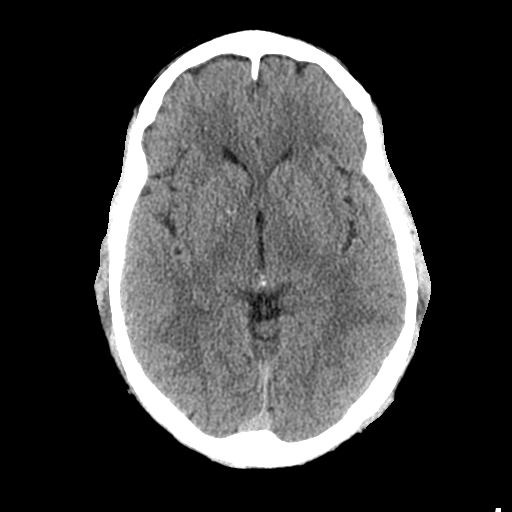
[im 16/31  brain]
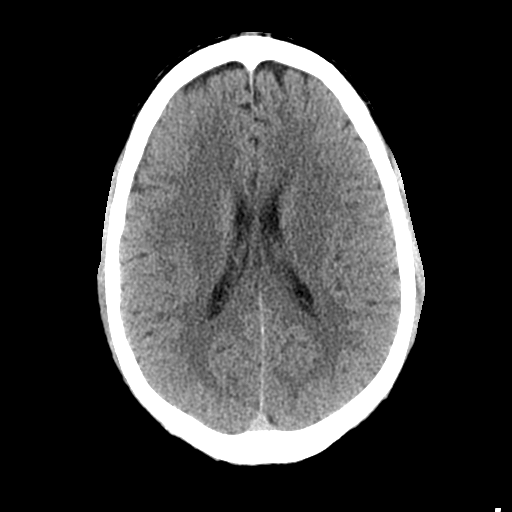
[im 16/31  bone]
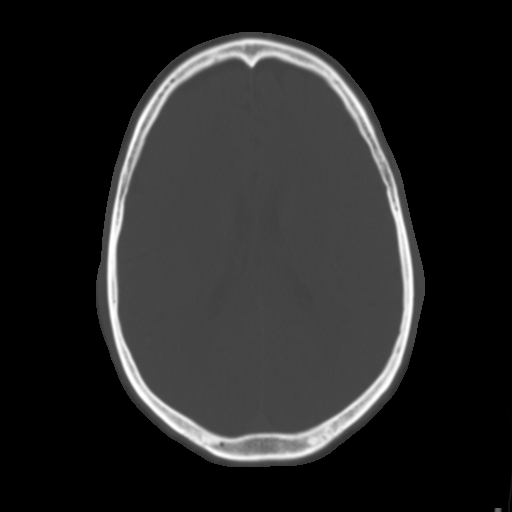
[im 19/31  brain]
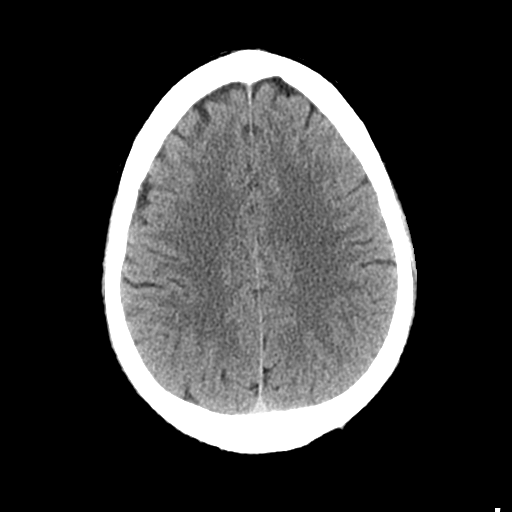
[im 22/31  brain]
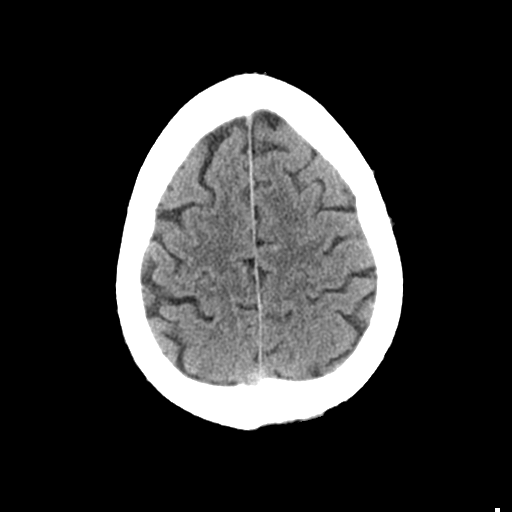
[im 25/31  brain]
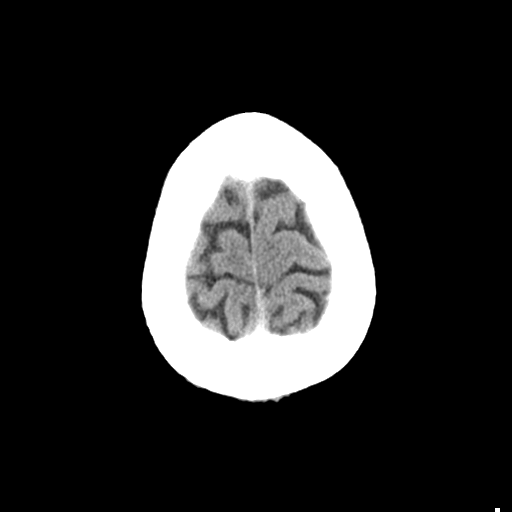
[im 28/31  brain]
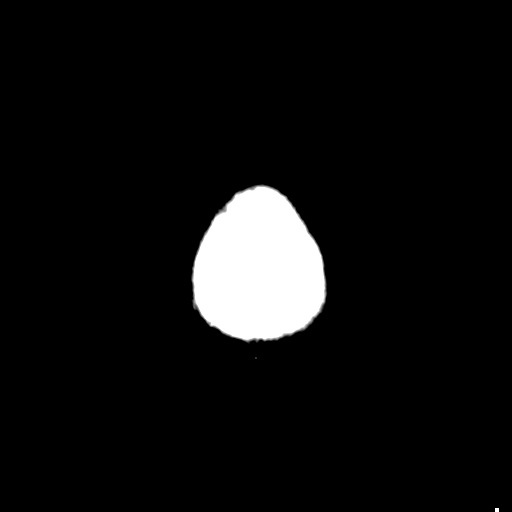
[im 28/31  bone]
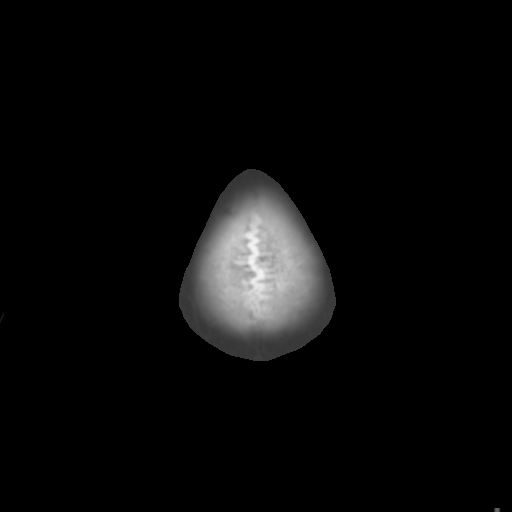

[Series 4: coronal soft · coronal · 0.31mm/px · 3 of 72 slices shown]
[im 24/72  brain]
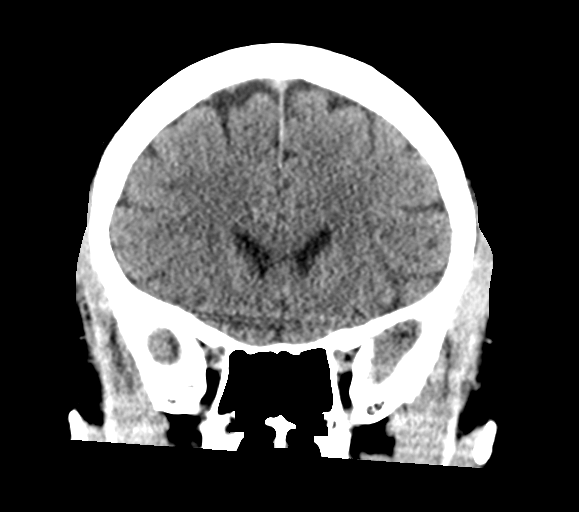
[im 32/72  brain]
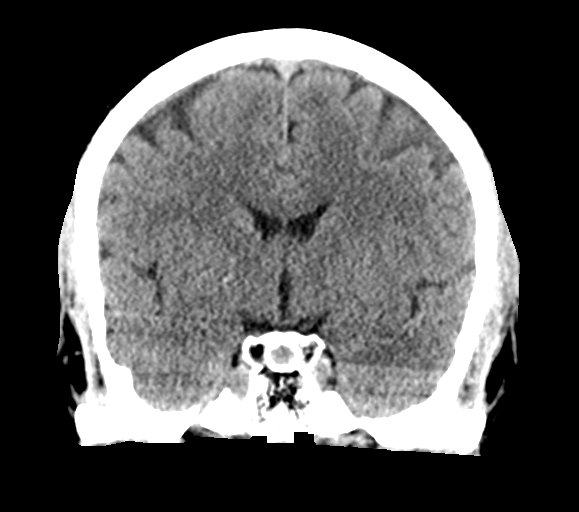
[im 40/72  brain]
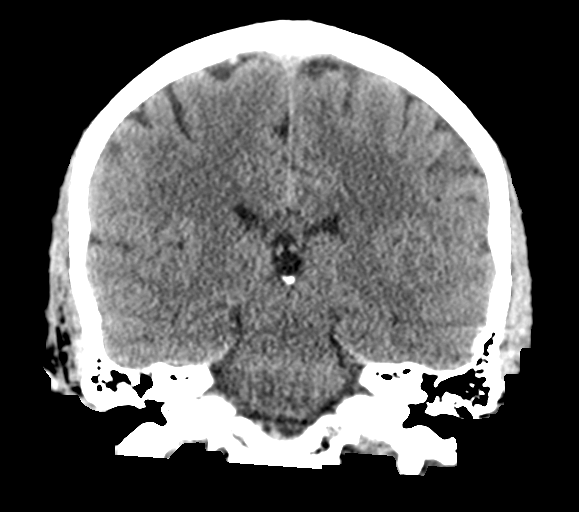

[Series 5: sagittal soft · sagittal · 0.34mm/px · 3 of 59 slices shown]
[im 20/59  brain]
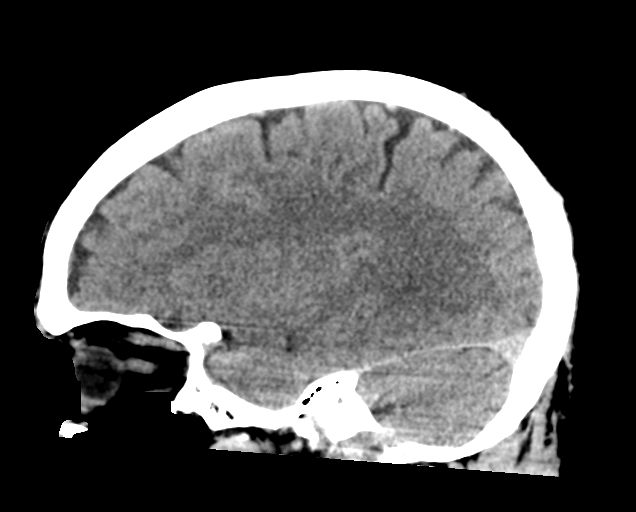
[im 30/59  brain]
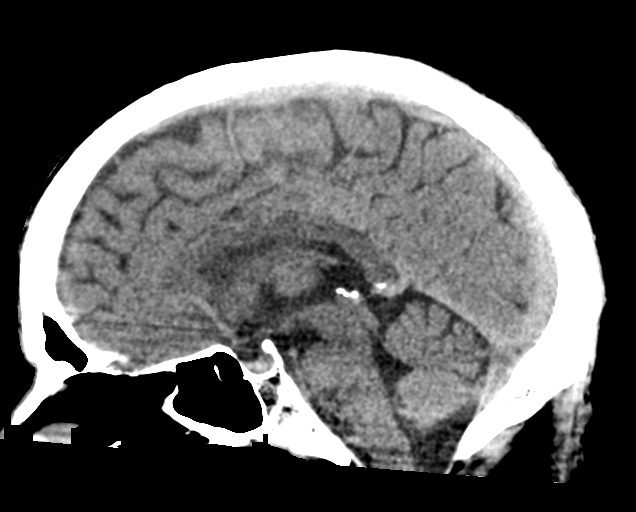
[im 39/59  brain]
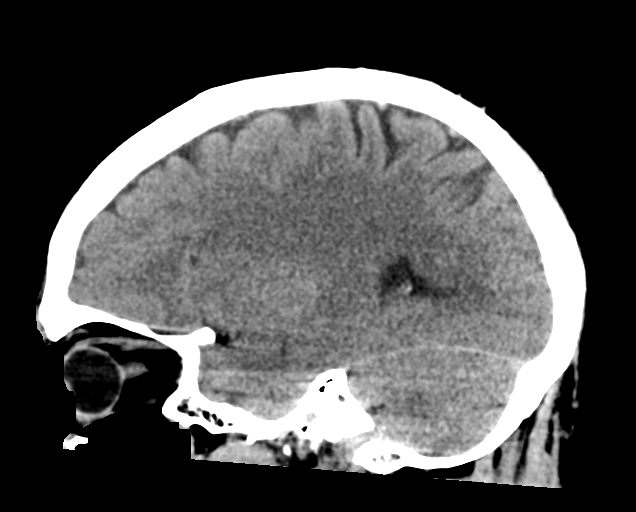

[15 of 47 positions shown; findings below may reference images not displayed]

FINDINGS: Brain: No acute intracranial hemorrhage, mass effect, or edema.
Gray-white differentiation is preserved. Ventricles and sulci are
within normal limits in size and configuration. There is no
extra-axial collection.

Vascular: No hyperdense vessel or unexpected calcification.

Skull: Unremarkable.

Sinuses/Orbits: No acute finding.

Other: Mastoid air cells are clear.

ASPECTS (Alberta Stroke Program Early CT Score)

- Ganglionic level infarction (caudate, lentiform nuclei, internal
capsule, insula, M1-M3 cortex): 7

- Supraganglionic infarction (M4-M6 cortex): 3

Total score (0-10 with 10 being normal): 10
IMPRESSION: There is no acute intracranial hemorrhage or evidence of acute
infarction. ASPECT score is 10.

These results were called by telephone at the time of interpretation
on [DATE] at [DATE] to provider BRACHA , who verbally
acknowledged these results.

## 2020-05-27 MED ORDER — ACETAMINOPHEN 325 MG PO TABS
650.0000 mg | ORAL_TABLET | Freq: Once | ORAL | Status: AC
  Administered 2020-05-27: 650 mg via ORAL
  Filled 2020-05-27: qty 2

## 2020-05-27 MED ORDER — SODIUM CHLORIDE 0.9 % IV BOLUS
1000.0000 mL | Freq: Once | INTRAVENOUS | Status: AC
  Administered 2020-05-27: 1000 mL via INTRAVENOUS

## 2020-05-27 MED ORDER — IOHEXOL 350 MG/ML SOLN
100.0000 mL | Freq: Once | INTRAVENOUS | Status: AC | PRN
  Administered 2020-05-27: 100 mL via INTRAVENOUS

## 2020-05-27 MED ORDER — TRAMADOL HCL 50 MG PO TABS: 50.0000 mg | ORAL_TABLET | Freq: Four times a day (QID) | ORAL | 0 refills | Status: DC | PRN

## 2020-05-27 MED ORDER — KETOROLAC TROMETHAMINE 30 MG/ML IJ SOLN
30.0000 mg | Freq: Once | INTRAMUSCULAR | Status: AC
  Administered 2020-05-27: 30 mg via INTRAVENOUS
  Filled 2020-05-27: qty 1

## 2020-05-27 NOTE — ED Notes (Signed)
Pt is aware we need urine sample, urinal at bedside.  

## 2020-05-27 NOTE — ED Notes (Signed)
Pt back from CT

## 2020-05-27 NOTE — ED Notes (Signed)
MD at bedside. 

## 2020-05-27 NOTE — Consult Note (Signed)
[facility] TELEMEDICINE  CONSULT   Date of service: May 27, 2020 Patient Name: Nicholas Brandt MRN:  [id number] DOB:  18-Oct-1955 Requesting Provider: Norman Clay Location of the provider: [facility] Neurohospitalist Office Location of the patient: [facility] [place] [facility] Reason for consult: "Stroke code"   This consult was provided via telemedicine with 2-way video and audio communication. The patient/family was informed that care would be provided in this way and agreed to receive care in this manner.   History of Present Illness  Nicholas Brandt is a 65 y.o. male with PMH significant for HTN, HLD who presents with syncope and slurred speech. Patient was at [facility] for neck pain. Unclear if he underwent any neck manipulation. Patient lost consciousness, was hot and sweaty and upon awakening, he was noted to have slurred speech but did not feel that his speech was slurred. Unclear downtime but not prolonged. LKW is 1000 on 05/27/20.  Symptoms had completely resolved and he only has neck pain.  No prior hx of strokes, PCP took him off Aspirin, no blood thinners. No prior hx of neck degenerative disc disease.  Stroke Measures   Last Known Well: 1000 TPA Given: No, NIHSS of 0 IR Thrombectomy: No, NIHSS of 0 mRS: Modified Rankin Scale: 0-Completely asymptomatic and back to baseline post- stroke Time of teleneurologist evaluation: 1158 on 05/27/20   Vitals   Vitals:   05/27/20 1130 05/27/20 1131  BP: (!) 142/90   Pulse: 65   Resp: 20   Temp: 98.4 F (36.9 C)   TempSrc: Oral   SpO2: 98%   Weight:  104.3 kg  Height:  5\' 8"  (1.727 m)     Body mass index is 34.97 kg/m.   Tele-neuro Exam and NIHSS   General: Awake, alert, oriented x 3, able to provide full history himself.   NIHSS components Score: Comment  1a Level of Conscious 0[x]  1[]  2[]  3[]      1b LOC Questions 0[x]  1[]  2[]       1c LOC Commands 0[x]  1[]  2[]       2 Best Gaze 0[x]  1[]  2[]       3  Visual 0[x]  1[]  2[]  3[]      4 Facial Palsy 0[x]  1[]  2[]  3[]      5a Motor Arm - left 0[x]  1[]  2[]  3[]  4[]  UN[]    5b Motor Arm - Right 0[x]  1[]  2[]  3[]  4[]  UN[]    6a Motor Leg - Left 0[x]  1[]  2[]  3[]  4[]  UN[]    6b Motor Leg - Right 0[x]  1[]  2[]  3[]  4[]  UN[]    7 Limb Ataxia 0[x]  1[]  2[]  3[]  UN[]     8 Sensory 0[x]  1[]  2[]  UN[]      9 Best Language 0[x]  1[]  2[]  3[]      10 Dysarthria 0[x]  1[]  2[]  UN[]      11 Extinct. and Inattention 0[]  1[]  2[]       TOTAL: 0     Other exam findings: None.  Imaging and Labs   CBC: No results for input(s): WBC, NEUTROABS, HGB, HCT, MCV, PLT in the last 168 hours.  Basic Metabolic Panel: No results found for: NA, K, CO2, GLUCOSE, BUN, CREATININE, CALCIUM, GFRNONAA, GFRAA, GLUCOSE Lipid Panel: No results found for: LDLCALC HgbA1c: No results found for: HGBA1C Urine Drug Screen: No results found for: LABOPIA, COCAINSCRNUR, LABBENZ, AMPHETMU, THCU, LABBARB  Alcohol Level No results found for: ETH  No results found for this or any previous visit.  CTH: CTH was negative for a large hypodensity concerning for  a large territory infarct or hyperdensity concerning for an ICH  CTA: No LVO  CTP: No mismatch.  Impression   Nicholas Brandt is a 65 y.o. male with PMH significant for HTN, HLD who presents after essentially passing out at a chiropractic clinic. He was noted to have slurred speech afterwards but he personally did not feel that his speech was slurry. He was also sweaty and hot. Symptoms are most consistent with a vasovagal syncope. Specially with him being ho and sweaty. Not uncommon to have a vasovagal episode in the setting of pain. He endorses significant neck pain but denies any lhermitte's sign. No ataxia, incoordination or any weakness noted on limited tele exam. Will get workup with MRI Brain without contrast to assess for any small stroke and get an MRI C spine to evaluate for potential cord impingement.  Recommendations  - MRI Brain without  contrast and MRI C spine without contrast. - No further workup if above is negative. - Plan discussed with ED Provider, Dr. Audley Hose over secure chat. He will follow up on the MRIs and call me with any questions. ______________________________________________________________________    This patient is receiving care for possible acute neurological changes. There was 40 minutes of care by this provider at the time of service, including time for direct evaluation via telemedicine, review of medical records, imaging studies and discussion of findings with providers, the patient and/or family.  Erick Blinks [facility] Pager Number [phone]  If 7pm- 7am, please page neurology on call as listed in [facility]ION.

## 2020-05-27 NOTE — ED Triage Notes (Signed)
Pt presents to ED for LOC. Pt states he went to ED with neck pain and was sent to the chiropractor, he worked on his neck and after he had a syncopal episode unsure of how long, hot and sweating, stuttering, and had slurred speech happened at around 1000.

## 2020-05-27 NOTE — Discharge Instructions (Addendum)
Call your primary care doctor or specialist as discussed in the next 2-3 days.   Return immediately back to the ER if:  Your symptoms worsen within the next 12-24 hours. You develop new symptoms such as new fevers, persistent vomiting, new pain, shortness of breath, or new weakness or numbness, or if you have any other concerns.  

## 2020-05-27 NOTE — ED Provider Notes (Signed)
[facility] EMERGENCY DEPARTMENT Provider Note   CSN: [id number] Arrival date & time: 05/27/20  1025  An emergency department physician performed an initial assessment on this suspected stroke patient at 1143.  History Chief Complaint  Patient presents with  . Loss of Consciousness    Nicholas Brandt is a 65 y.o. male.  Patient with history of persistent neck pain presents with acute episode of lightheadedness dizziness, syncope and questionable slurred speech.  He states he was at his chiropractor's when they did a manipulation of his cervical spine today at around 9:30 AM.  At about 10 AM he felt lightheaded and dizzy and as he was standing up he passed out.  When he got up he was told that he had slurred speech, he feels like his speech pattern is slower than normal.  Denies any new weakness anywhere.  However he states his gait feels unsteady and dizzy.  Denies fevers cough vomiting or diarrhea.        Past Medical History:  Diagnosis Date  . Hyperlipidemia   . Hypertension     There are no problems to display for this patient.   History reviewed. No pertinent surgical history.     No family history on file.  Social History   Tobacco Use  . Smoking status: Never Smoker  . Smokeless tobacco: Never Used  Substance Use Topics  . Alcohol use: Not Currently  . Drug use: Not Currently    Home Medications Prior to Admission medications   Medication Sig Start Date End Date Taking? Authorizing Provider  aspirin 81 MG EC tablet Take by mouth.   Yes [provider]  atorvastatin (LIPITOR) 20 MG tablet Take 20 mg by mouth daily.   Yes [provider]  cyclobenzaprine (FLEXERIL) 10 MG tablet Take 10 mg by mouth 3 (three) times daily as needed for muscle spasms.   Yes [provider]  Multiple Vitamin (MULTIVITAMIN) capsule Take 1 capsule by mouth daily. Reported on 10/06/2015   Yes [provider]  prazosin (MINIPRESS) 1 MG capsule  Take 1 mg by mouth at bedtime. 12/30/19  Yes [provider]  valsartan-hydrochlorothiazide (DIOVAN-HCT) 80-12.5 MG tablet Take 1 tablet by mouth daily. 02/04/20  Yes [provider]  benzonatate (TESSALON) 200 MG capsule Take 1 capsule (200 mg total) by mouth 3 (three) times daily as needed. Patient not taking: No sig reported 10/06/15   Sherren Mocha, MD  diclofenac (VOLTAREN) 75 MG EC tablet Take 1 tablet (75 mg total) by mouth 2 (two) times daily. Patient not taking: No sig reported 03/30/20   Lenn Sink, DPM  Guaifenesin (MUCINEX MAXIMUM STRENGTH) 1200 MG TB12 Take 1 tablet (1,200 mg total) by mouth every 12 (twelve) hours as needed. Patient not taking: No sig reported 10/06/15   Sherren Mocha, MD  mirtazapine (REMERON) 15 MG tablet Take by mouth. Patient not taking: Reported on 05/27/2020    [provider]  pantoprazole (PROTONIX) 40 MG tablet Take 40 mg by mouth daily. Patient not taking: No sig reported 05/23/16   [provider]  predniSONE (DELTASONE) 20 MG tablet Take 1 tablet (20 mg total) by mouth daily with breakfast. Patient not taking: No sig reported 10/02/14   Peyton Najjar, MD  sildenafil (VIAGRA) 100 MG tablet TAKE ONE TABLET BY MOUTH DAILY AS NEEDED FOR ERECTILE DYSFUNCTION. Patient not taking: No sig reported 05/30/16   [provider]  triamcinolone acetonide (KENALOG-40) 40 MG/ML injection (RADIOLOGY ONLY) Inject  into the articular space. 08/22/19   [provider]    Allergies    Patient has no known allergies.  Review of Systems   Review of Systems  Constitutional: Negative for fever.  HENT: Negative for ear pain and sore throat.   Eyes: Negative for pain.  Respiratory: Negative for cough.   Cardiovascular: Negative for chest pain.  Gastrointestinal: Negative for abdominal pain.  Genitourinary: Negative for flank pain.  Musculoskeletal: Negative for back pain.  Skin: Negative for color change and rash.   Neurological: Negative for syncope.  All other systems reviewed and are negative.   Physical Exam Updated Vital Signs BP (!) 155/102   Pulse 86   Temp 98.4 F (36.9 C) (Oral)   Resp (!) 24   Ht 5\' 8"  (1.727 m)   Wt 104.3 kg   SpO2 99%   BMI 34.97 kg/m   Physical Exam Constitutional:      General: He is not in acute distress.    Appearance: He is well-developed.  HENT:     Head: Normocephalic.     Nose: Nose normal.  Eyes:     Extraocular Movements: Extraocular movements intact.  Cardiovascular:     Rate and Rhythm: Normal rate.  Pulmonary:     Effort: Pulmonary effort is normal.  Skin:    Coloration: Skin is not jaundiced.  Neurological:     General: No focal deficit present.     Mental Status: He is alert.     Comments: Cranial nerves II through XII appear intact.  Strength is 5/5 all extremities finger-nose intact.  When I get the patient up to walk he does complain of some unsteady gait but is able to weight-bear and stand.     ED Results / Procedures / Treatments   Labs (all labs ordered are listed, but only abnormal results are displayed) Labs Reviewed  CBC - Abnormal; Notable for the following components:      Result Value   Platelets 145 (*)    All other components within normal limits  COMPREHENSIVE METABOLIC PANEL - Abnormal; Notable for the following components:   Creatinine, Ser 1.29 (*)    Total Bilirubin 0.2 (*)    All other components within normal limits  URINALYSIS, ROUTINE W REFLEX MICROSCOPIC - Abnormal; Notable for the following components:   Color, Urine STRAW (*)    Specific Gravity, Urine >1.046 (*)    All other components within normal limits  ETHANOL  PROTIME-INR  APTT  DIFFERENTIAL  RAPID URINE DRUG SCREEN, HOSP PERFORMED  CBG MONITORING, ED  I-STAT CHEM 8, ED    EKG None  Radiology CT Angio Head W or Wo Contrast  Result Date: 05/27/2020 CLINICAL DATA:  Code stroke follow-up EXAM: CT ANGIOGRAPHY HEAD AND NECK CT  PERFUSION BRAIN TECHNIQUE: Multidetector CT imaging of the head and neck was performed using the standard protocol during bolus administration of intravenous contrast. Multiplanar CT image reconstructions and MIPs were obtained to evaluate the vascular anatomy. Carotid stenosis measurements (when applicable) are obtained utilizing NASCET criteria, using the distal internal carotid diameter as the denominator. Multiphase CT imaging of the brain was performed following IV bolus contrast injection. Subsequent parametric perfusion maps were calculated using RAPID software. CONTRAST:  05/29/2020 OMNIPAQUE IOHEXOL 350 MG/ML SOLN COMPARISON:  None. FINDINGS: CTA NECK FINDINGS Aortic arch: Great vessel origins are patent. Right carotid system: Patent. Minimal calcified plaque at the ICA origin. No measurable stenosis or evidence of dissection. Left carotid system: Patent. Minimal calcified  plaque at the ICA origin. No measurable stenosis or evidence of dissection. Vertebral arteries: Patent. Left vertebral artery is slightly dominant. No measurable stenosis or evidence of dissection. Skeleton: Degenerative changes of the cervical spine, greatest at C6-C7. Other neck: No mass or adenopathy. Upper chest: No apical lung mass. Review of the MIP images confirms the above findings CTA HEAD FINDINGS Anterior circulation: Intracranial internal carotid arteries are patent with mild calcified plaque. Anterior and middle cerebral arteries are patent. Posterior circulation: Intracranial vertebral arteries, basilar artery, and posterior cerebral arteries are patent. Patent bilateral posterior communicating arteries, right larger than left. Venous sinuses: As permitted by contrast timing, patent. Review of the MIP images confirms the above findings CT Brain Perfusion Findings: CBF (<30%) Volume: 0mL Perfusion (Tmax>6.0s) volume: 0mL Mismatch Volume: 0mL Infarction Location: None IMPRESSION: No large vessel occlusion, hemodynamically  significant stenosis, or evidence of dissection. Perfusion imaging demonstrates no evidence of core infarction or territory at risk. Electronically Signed   By: Guadlupe SpanishPraneil  Patel M.D.   On: 05/27/2020 12:22   CT Angio Neck W and/or Wo Contrast  Result Date: 05/27/2020 CLINICAL DATA:  Code stroke follow-up EXAM: CT ANGIOGRAPHY HEAD AND NECK CT PERFUSION BRAIN TECHNIQUE: Multidetector CT imaging of the head and neck was performed using the standard protocol during bolus administration of intravenous contrast. Multiplanar CT image reconstructions and MIPs were obtained to evaluate the vascular anatomy. Carotid stenosis measurements (when applicable) are obtained utilizing NASCET criteria, using the distal internal carotid diameter as the denominator. Multiphase CT imaging of the brain was performed following IV bolus contrast injection. Subsequent parametric perfusion maps were calculated using RAPID software. CONTRAST:  100mL OMNIPAQUE IOHEXOL 350 MG/ML SOLN COMPARISON:  None. FINDINGS: CTA NECK FINDINGS Aortic arch: Great vessel origins are patent. Right carotid system: Patent. Minimal calcified plaque at the ICA origin. No measurable stenosis or evidence of dissection. Left carotid system: Patent. Minimal calcified plaque at the ICA origin. No measurable stenosis or evidence of dissection. Vertebral arteries: Patent. Left vertebral artery is slightly dominant. No measurable stenosis or evidence of dissection. Skeleton: Degenerative changes of the cervical spine, greatest at C6-C7. Other neck: No mass or adenopathy. Upper chest: No apical lung mass. Review of the MIP images confirms the above findings CTA HEAD FINDINGS Anterior circulation: Intracranial internal carotid arteries are patent with mild calcified plaque. Anterior and middle cerebral arteries are patent. Posterior circulation: Intracranial vertebral arteries, basilar artery, and posterior cerebral arteries are patent. Patent bilateral posterior  communicating arteries, right larger than left. Venous sinuses: As permitted by contrast timing, patent. Review of the MIP images confirms the above findings CT Brain Perfusion Findings: CBF (<30%) Volume: 0mL Perfusion (Tmax>6.0s) volume: 0mL Mismatch Volume: 0mL Infarction Location: None IMPRESSION: No large vessel occlusion, hemodynamically significant stenosis, or evidence of dissection. Perfusion imaging demonstrates no evidence of core infarction or territory at risk. Electronically Signed   By: Guadlupe SpanishPraneil  Patel M.D.   On: 05/27/2020 12:22   MR BRAIN WO CONTRAST  Result Date: 05/27/2020 CLINICAL DATA:  Transient ischemic attack. EXAM: MRI HEAD WITHOUT CONTRAST TECHNIQUE: Multiplanar, multiecho pulse sequences of the brain and surrounding structures were obtained without intravenous contrast. COMPARISON:  Head CT May 27, 2020 FINDINGS: Brain: No acute infarction, hemorrhage, hydrocephalus, extra-axial collection or mass lesion. Rare punctate foci of T2 hyperintense are seen within the white matter of the cerebral hemispheres, nonspecific. Vascular: Normal flow voids. Skull and upper cervical spine: Normal marrow signal. Sinuses/Orbits: Mild mucosal thickening of the right maxillary sinus. The orbits are  maintained. IMPRESSION: 1. No acute intracranial abnormality. 2. Rare punctate foci of T2 hyperintense within the white matter of the cerebral hemispheres are nonspecific but may be seen in the setting of early chronic microvascular ischemic disease. Electronically Signed   By: Baldemar Lenis M.D.   On: 05/27/2020 14:12   MR CERVICAL SPINE WO CONTRAST  Result Date: 05/27/2020 CLINICAL DATA:  Neck pain. EXAM: MRI CERVICAL SPINE WITHOUT CONTRAST TECHNIQUE: Multiplanar, multisequence MR imaging of the cervical spine was performed. No intravenous contrast was administered. COMPARISON:  CTA 05/27/2020 FINDINGS: Alignment: Straightening and slight reversal of the cervical lordosis without  static listhesis. Vertebrae: No fracture, evidence of discitis, or bone lesion. Mild bone edema within left pedicle and articular process of C3 and within the right articular process C4 (series 11, images 3 and 12). No corresponding fracture line is evident. Cord: Normal signal and morphology. Posterior Fossa, vertebral arteries, paraspinal tissues: Vertebral artery flow voids are identified bilaterally. No soft tissue fluid collection or hematoma. Please see dedicated MRI of the brain for characterization of the posterior fossa. Disc levels: C2-C3: No significant disc protrusion. Mild bilateral facet arthropathy. No foraminal or canal stenosis. C3-C4: Minimal disc osteophyte complex with small central disc protrusion. Mild bilateral facet arthropathy. No foraminal or canal stenosis. C4-C5: Minimal disc osteophyte complex and minimal bilateral facet arthropathy. No foraminal or canal stenosis. C5-C6: Small posterior disc osteophyte complex with mild bilateral facet arthropathy and left-sided uncovertebral spurring. Findings result in mild left foraminal stenosis without canal stenosis. C6-C7: Small posterior disc osteophyte complex, slightly eccentric to the left. No foraminal or canal stenosis. C7-T1: Small right paracentral disc protrusion without foraminal or canal stenosis. IMPRESSION: 1. Mild bone edema within the posterior elements on the left at C3 and on the right at C4. No corresponding fracture line is evident. Findings may reflect bone contusions/stress change or be reactive to mild facet arthropathy. 2. Mild multilevel degenerative changes of the cervical spine with mild left foraminal stenosis at C5-6. No canal stenosis at any level. Electronically Signed   By: Duanne Guess D.O.   On: 05/27/2020 14:25   CT CEREBRAL PERFUSION W CONTRAST  Result Date: 05/27/2020 CLINICAL DATA:  Code stroke follow-up EXAM: CT ANGIOGRAPHY HEAD AND NECK CT PERFUSION BRAIN TECHNIQUE: Multidetector CT imaging of the  head and neck was performed using the standard protocol during bolus administration of intravenous contrast. Multiplanar CT image reconstructions and MIPs were obtained to evaluate the vascular anatomy. Carotid stenosis measurements (when applicable) are obtained utilizing NASCET criteria, using the distal internal carotid diameter as the denominator. Multiphase CT imaging of the brain was performed following IV bolus contrast injection. Subsequent parametric perfusion maps were calculated using RAPID software. CONTRAST:  OMNIPAQUE IOHEXOL 350 MG/ML SOLN COMPARISON:  None. FINDINGS: CTA NECK FINDINGS Aortic arch: Great vessel origins are patent. Right carotid system: Patent. Minimal calcified plaque at the ICA origin. No measurable stenosis or evidence of dissection. Left carotid system: Patent. Minimal calcified plaque at the ICA origin. No measurable stenosis or evidence of dissection. Vertebral arteries: Patent. Left vertebral artery is slightly dominant. No measurable stenosis or evidence of dissection. Skeleton: Degenerative changes of the cervical spine, greatest at C6-C7. Other neck: No mass or adenopathy. Upper chest: No apical lung mass. Review of the MIP images confirms the above findings CTA HEAD FINDINGS Anterior circulation: Intracranial internal carotid arteries are patent with mild calcified plaque. Anterior and middle cerebral arteries are patent. Posterior circulation: Intracranial vertebral arteries, basilar artery, and  posterior cerebral arteries are patent. Patent bilateral posterior communicating arteries, right larger than left. Venous sinuses: As permitted by contrast timing, patent. Review of the MIP images confirms the above findings CT Brain Perfusion Findings: CBF (<30%) Volume: 49mL Perfusion (Tmax>6.0s) volume: 49mL Mismatch Volume: 62mL Infarction Location: None IMPRESSION: No large vessel occlusion, hemodynamically significant stenosis, or evidence of dissection. Perfusion imaging  demonstrates no evidence of core infarction or territory at risk. Electronically Signed   By: Guadlupe Spanish M.D.   On: 05/27/2020 12:22   DG Chest Port 1 View  Result Date: 05/27/2020 CLINICAL DATA:  Neck pain, sweating and stuttering with slurred speech after a chiropractor visit EXAM: PORTABLE CHEST 1 VIEW COMPARISON:  02/24/2010 FINDINGS: Chronic indistinctness of the right heart border likely due to scarring or epicardial adipose tissue. This is unchanged from 2011. Low lung volumes are present, causing crowding of the pulmonary vasculature. Borderline enlargement of the cardiopericardial silhouette, without edema. Borderline elevation of the right hemidiaphragm. The projection is mildly apical lordotic. IMPRESSION: 1. Low lung volumes. 2. Borderline enlargement of the cardiopericardial silhouette, without edema. 3. Chronic indistinctness of the right heart border (no change from 2011) likely due to scarring or epicardial adipose tissue. Electronically Signed   By: Gaylyn Rong M.D.   On: 05/27/2020 12:57   CT HEAD CODE STROKE WO CONTRAST  Result Date: 05/27/2020 CLINICAL DATA:  Code stroke. EXAM: CT HEAD WITHOUT CONTRAST TECHNIQUE: Contiguous axial images were obtained from the base of the skull through the vertex without intravenous contrast. COMPARISON:  None. FINDINGS: Brain: No acute intracranial hemorrhage, mass effect, or edema. Gray-white differentiation is preserved. Ventricles and sulci are within normal limits in size and configuration. There is no extra-axial collection. Vascular: No hyperdense vessel or unexpected calcification. Skull: Unremarkable. Sinuses/Orbits: No acute finding. Other: Mastoid air cells are clear. ASPECTS (Alberta Stroke Program Early CT Score) - Ganglionic level infarction (caudate, lentiform nuclei, internal capsule, insula, M1-M3 cortex): 7 - Supraganglionic infarction (M4-M6 cortex): 3 Total score (0-10 with 10 being normal): 10 IMPRESSION: There is no acute  intracranial hemorrhage or evidence of acute infarction. ASPECT score is 10. These results were called by telephone at the time of interpretation on 05/27/2020 at 12:00 pm to provider [facility] , who verbally acknowledged these results. Electronically Signed   By: Guadlupe Spanish M.D.   On: 05/27/2020 12:01    Procedures .Critical Care Performed by: Cheryll Cockayne, MD Authorized by: Cheryll Cockayne, MD   Critical care provider statement:    Critical care time (minutes):  45   Critical care time was exclusive of:  Separately billable procedures and treating other patients and teaching time   Critical care was necessary to treat or prevent imminent or life-threatening deterioration of the following conditions:  CNS failure or compromise   Critical care was time spent personally by me on the following activities:  Discussions with consultants, evaluation of patient's response to treatment, examination of patient, ordering and performing treatments and interventions, ordering and review of laboratory studies, ordering and review of radiographic studies, pulse oximetry, re-evaluation of patient's condition, obtaining history from patient or surrogate and review of old charts Comments:     Stroke alert activation   (including critical care time)  Medications Ordered in ED Medications  iohexol (OMNIPAQUE) 350 MG/ML injection 100 mL (100 mLs Intravenous Contrast Given 05/27/20 1200)  sodium chloride 0.9 % bolus 1,000 mL (1,000 mLs Intravenous New Bag/Given 05/27/20 1314)  ketorolac (TORADOL) 30 MG/ML injection 30 mg (  30 mg Intravenous Given 05/27/20 1312)  acetaminophen (TYLENOL) tablet 650 mg (650 mg Oral Given 05/27/20 1311)    ED Course  I have reviewed the triage vital signs and the nursing notes.  Pertinent labs & imaging results that were available during my care of the patient were reviewed by me and considered in my medical decision making (see chart for details).    MDM  Rules/Calculators/A&P                          Given his history and exam concern for cervical vascular dissection or injury, acute CVA, versus other differentials considered.  Stroke alert was activated, CT angio shows no intracranial or other lesion in the neck vasculature.  Case discussed with neurology.  MRI pursued here in the ER did not show any acute findings.  Will recommend continued outpatient follow-up, advised rest at home fluid hydration, immediate return if he has worsening symptoms fevers pain or any additional concerns.   Final Clinical Impression(s) / ED Diagnoses Final diagnoses:  Syncope    Rx / DC Orders ED Discharge Orders    None       Cheryll CockayneHong, Jontez Redfield S, MD 05/27/20 1432

## 2020-06-01 ENCOUNTER — Other Ambulatory Visit: Payer: Self-pay

## 2020-06-01 ENCOUNTER — Ambulatory Visit
Admission: RE | Admit: 2020-06-01 | Discharge: 2020-06-01 | Disposition: A | Discharge status: Home / Self Care | Payer: 59 | Source: Ambulatory Visit | Attending: Podiatry | Admitting: Podiatry

## 2020-06-01 DIAGNOSIS — S86012D Strain of left Achilles tendon, subsequent encounter: Secondary | ICD-10-CM

## 2020-06-01 DIAGNOSIS — M84375A Stress fracture, left foot, initial encounter for fracture: Secondary | ICD-10-CM

## 2020-06-01 IMAGING — MR MR ANKLE*L* W/O CM
8 series · 40 of 40 positions shown · non-contrast
Comparison: None.

CLINICAL DATA: Ankle pain, foot pain

EXAM:
MRI OF THE LEFT ANKLE WITHOUT CONTRAST
TECHNIQUE: Multiplanar, multisequence MR imaging of the ankle was performed. No
intravenous contrast was administered.

[Series 5: T2 fat-sat · axial · 3.0mm · 0.50mm/px · z∈[-33,+92]mm · 7 of 33 slices shown (1 of 2)]
[im 1/33]
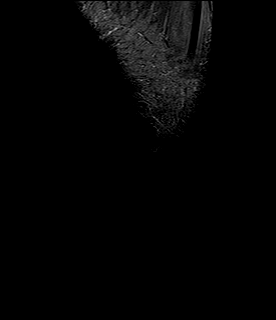
[im 6/33]
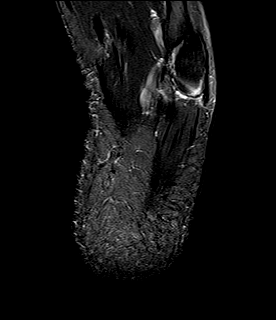
[im 11/33]
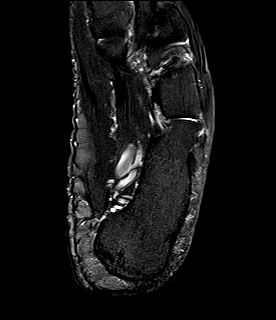
[im 17/33]
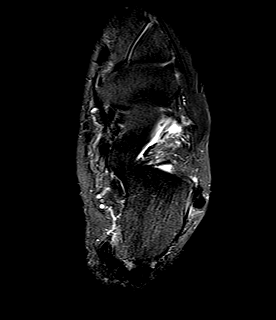
[im 22/33]
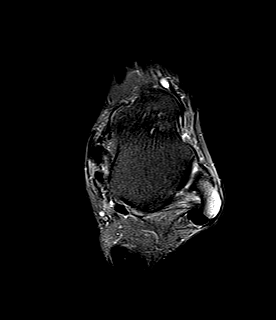
[im 27/33]
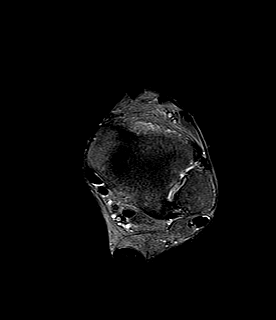
[im 33/33]
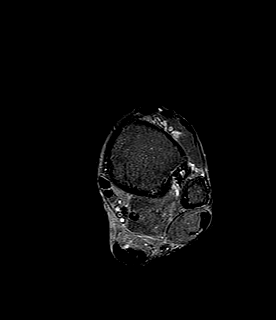

[Series 6: T1 · axial · 3.0mm · 0.50mm/px · z∈[-33,+91]mm · 7 of 33 slices shown (1 of 3)]
[im 1/33]
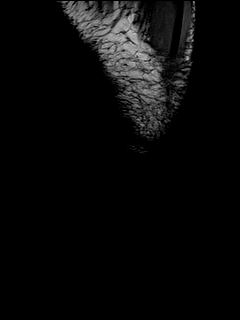
[im 6/33]
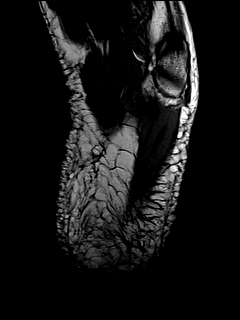
[im 11/33]
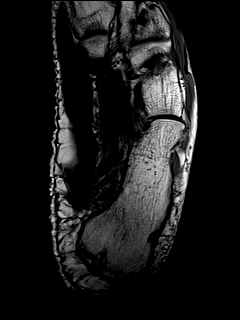
[im 17/33]
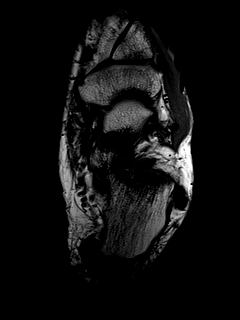
[im 22/33]
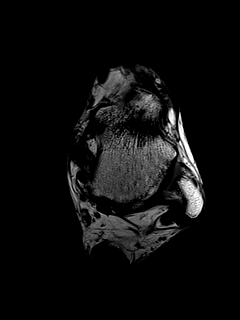
[im 27/33]
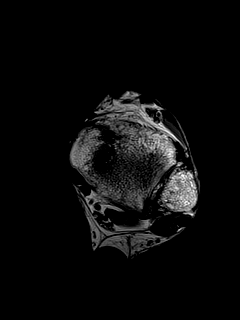
[im 33/33]
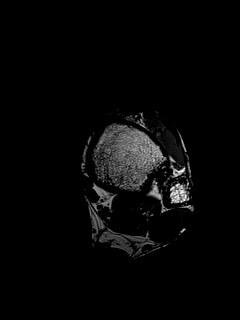

[Series 7: PD fat-sat · axial · 3.0mm · 0.42mm/px · z∈[-33,+92]mm · 6 of 33 slices shown]
[im 1/33]
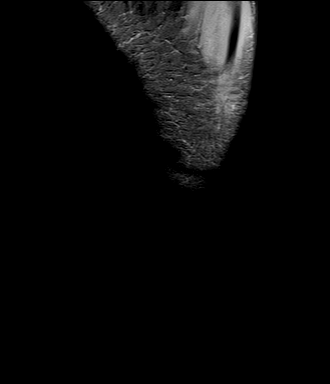
[im 7/33]
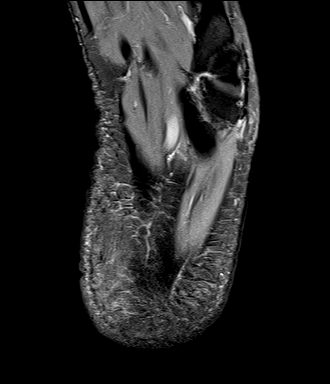
[im 13/33]
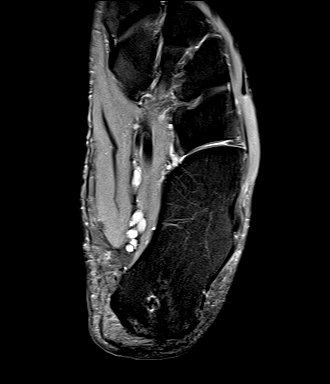
[im 20/33]
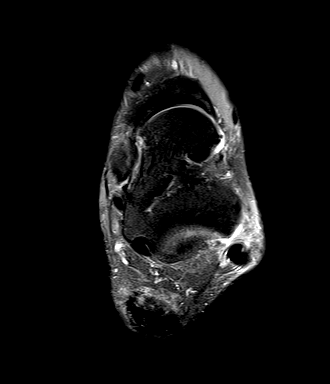
[im 26/33]
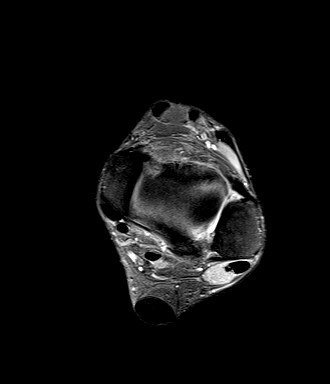
[im 33/33]
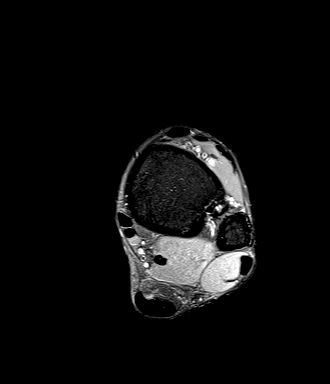

[Series 8: T1 · sagittal · 4.0mm · 0.56mm/px · 3 of 18 slices shown (2 of 3)]
[im 1/18]
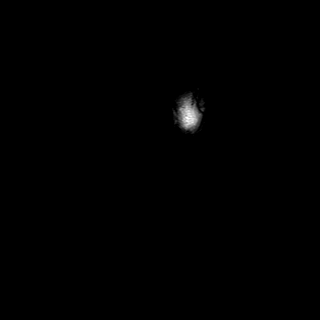
[im 9/18]
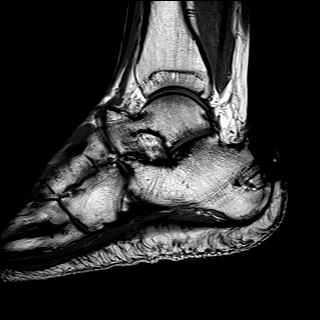
[im 18/18]
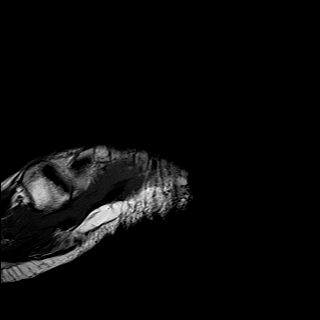

[Series 9: STIR · sagittal · 4.0mm · 0.56mm/px · 3 of 18 slices shown (1 of 2)]
[im 1/18]
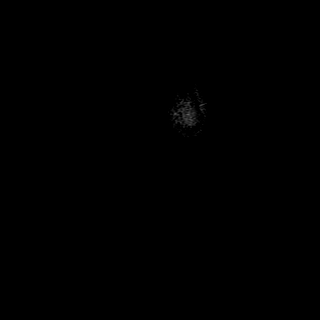
[im 9/18]
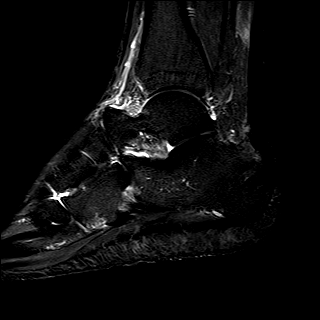
[im 18/18]
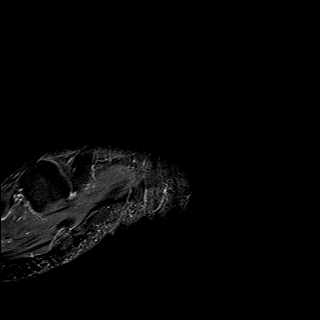

[Series 10: T2 fat-sat · coronal · 3.0mm · 0.50mm/px · 6 of 32 slices shown (2 of 2)]
[im 1/32]
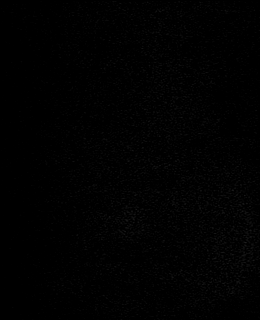
[im 7/32]
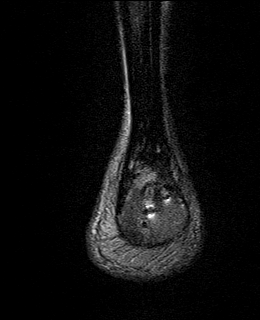
[im 13/32]
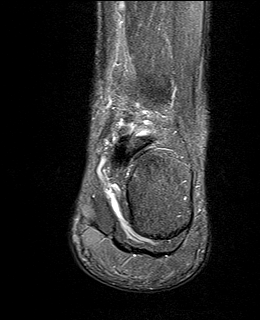
[im 19/32]
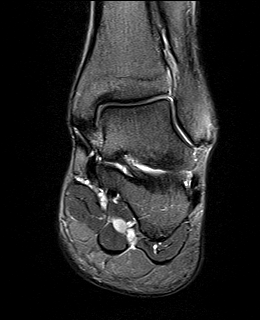
[im 25/32]
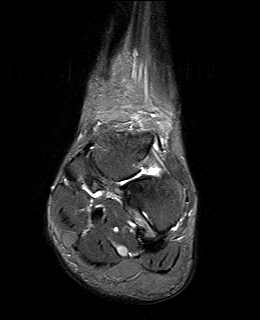
[im 32/32]
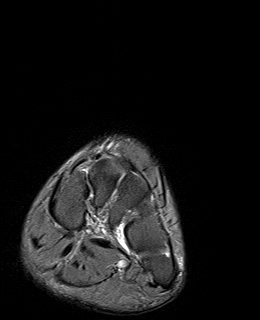

[Series 11: STIR · sagittal · 4.0mm · 0.56mm/px · 4 of 20 slices shown (2 of 2)]
[im 1/20]
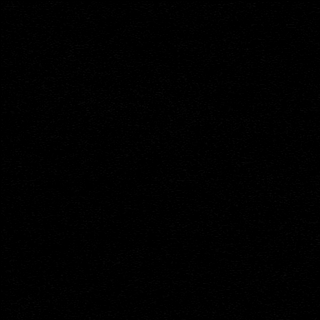
[im 7/20]
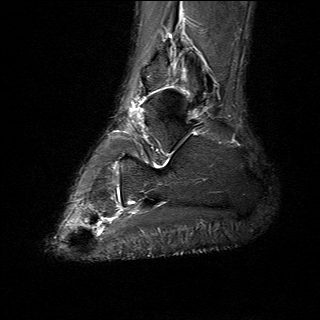
[im 13/20]
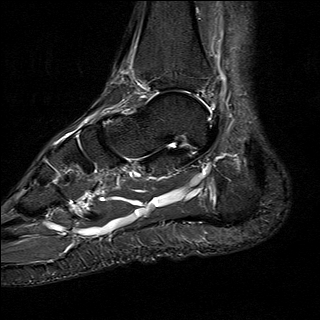
[im 20/20]
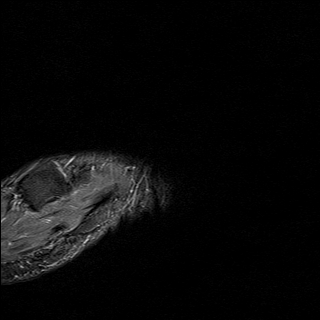

[Series 12: T1 · sagittal · 4.0mm · 0.56mm/px · 4 of 20 slices shown (3 of 3)]
[im 1/20]
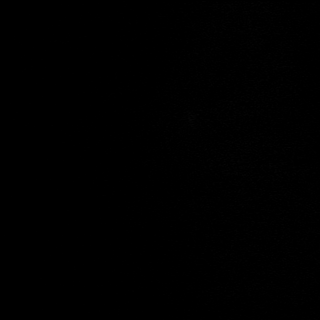
[im 7/20]
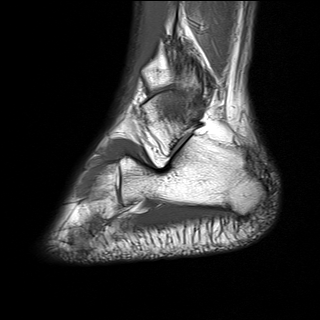
[im 13/20]
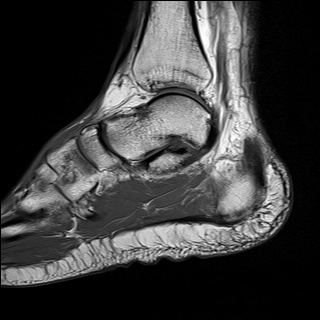
[im 20/20]
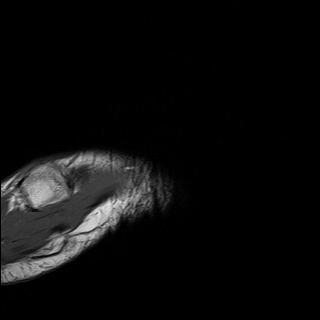

[40 of 40 positions shown; findings below may reference images not displayed]

FINDINGS: TENDONS

Peroneal: Peroneal longus tendon intact. Peroneal brevis intact.

Posteromedial: Posterior tibial tendon intact. Flexor hallucis
longus tendon intact. Flexor digitorum longus tendon intact.

Anterior: Tibialis anterior tendon intact. Extensor hallucis longus
tendon intact Extensor digitorum longus tendon intact.

Achilles: Prior Achilles tendon repair with postsurgical changes and
thickening of the Achilles tendon, but no intrinsic signal
abnormality to suggest tendinosis. No recurrent Achilles tendon
tear.

Plantar Fascia: Intact.

LIGAMENTS

Lateral: Anterior talofibular ligament intact. Calcaneofibular
ligament intact. Posterior talofibular ligament intact. Anterior and
posterior tibiofibular ligaments intact.

Medial: Deltoid ligament intact. Spring ligament intact.

CARTILAGE

Ankle Joint: No joint effusion. Normal ankle mortise. No chondral
defect.

Subtalar Joints/Sinus Tarsi: Normal subtalar joints. No subtalar
joint effusion. Normal sinus tarsi.

Bones: No marrow signal abnormality. No fracture or dislocation.
Mild osteoarthritis of the talonavicular joint.

Soft Tissue: No fluid collection or hematoma. Muscles are normal
without edema or atrophy. Tarsal tunnel is normal.
IMPRESSION: 1. Prior Achilles tendon repair with postsurgical changes and
thickening of the Achilles tendon, but no intrinsic signal
abnormality to suggest tendinosis. No recurrent Achilles tendon
tear.

## 2020-06-12 ENCOUNTER — Encounter: Payer: Self-pay | Admitting: Podiatry

## 2020-06-12 ENCOUNTER — Ambulatory Visit: Payer: 59 | Admitting: Podiatry

## 2020-06-12 ENCOUNTER — Other Ambulatory Visit: Payer: Self-pay

## 2020-06-12 DIAGNOSIS — M79672 Pain in left foot: Secondary | ICD-10-CM

## 2020-06-12 DIAGNOSIS — S86012D Strain of left Achilles tendon, subsequent encounter: Secondary | ICD-10-CM | POA: Diagnosis not present

## 2020-06-12 DIAGNOSIS — M84375A Stress fracture, left foot, initial encounter for fracture: Secondary | ICD-10-CM

## 2020-06-13 NOTE — Progress Notes (Signed)
Subjective:   Patient ID: Nicholas Brandt, male   DOB: 65 y.o.   MRN: [id number]   HPI Patient continues to experience discomfort posterior aspect left Achilles tendon that is showing may be very minimal improvement but continues to be a bother for him and make it hard for him to be active   ROS      Objective:  Physical Exam  Neurovascular status was found to be intact with patient found to have discomfort in the posterior aspect of the left heel that is more diffuse also hurting in his foot in different areas.  Patient does not have specific description of what is causing this problem     Assessment:  Chronic Achilles tendinitis left with mild foot problems and ankle problems that did not show up on MRI     Plan:  H&P reviewed the results of MRI.  With him I explained that there is no indications that there is a tear of the Achilles no indications of excess fluid in the area and it appears to be inflammatory and at this point I have recommended physical therapy to try to stretch the area and hopefully prevent further problems per.  I would consider shockwave therapy for this patient if the symptoms were to persist but at this point were going to hold off and try and see whether or not it will get better with physical therapy

## 2020-07-01 ENCOUNTER — Telehealth: Payer: Self-pay | Admitting: Podiatry

## 2020-07-01 NOTE — Telephone Encounter (Signed)
Nicholas Blew- can you please take care of this and call the patient? Thanks.

## 2020-07-01 NOTE — Telephone Encounter (Signed)
Patient calling to follow up on physical therapy for ankle. Patient called to schedule appointment for therapy but there was no referral in chart.

## 2020-07-02 ENCOUNTER — Other Ambulatory Visit: Payer: Self-pay

## 2020-07-02 DIAGNOSIS — S86012D Strain of left Achilles tendon, subsequent encounter: Secondary | ICD-10-CM

## 2020-11-23 DIAGNOSIS — M9260 Juvenile osteochondrosis of tarsus, unspecified ankle: Secondary | ICD-10-CM | POA: Insufficient documentation

## 2021-02-20 ENCOUNTER — Ambulatory Visit
Admission: EM | Admit: 2021-02-20 | Discharge: 2021-02-20 | Disposition: A | Discharge status: Home / Self Care | Payer: 59

## 2021-02-20 ENCOUNTER — Other Ambulatory Visit: Payer: Self-pay

## 2021-02-20 DIAGNOSIS — J069 Acute upper respiratory infection, unspecified: Secondary | ICD-10-CM

## 2021-02-20 DIAGNOSIS — R070 Pain in throat: Secondary | ICD-10-CM

## 2021-02-20 MED ORDER — PROMETHAZINE-DM 6.25-15 MG/5ML PO SYRP: 5.0000 mL | ORAL_SOLUTION | Freq: Every evening | ORAL | 0 refills | Status: DC | PRN

## 2021-02-20 MED ORDER — BENZONATATE 100 MG PO CAPS: 100.0000 mg | ORAL_CAPSULE | Freq: Three times a day (TID) | ORAL | 0 refills | Status: DC | PRN

## 2021-02-20 MED ORDER — CETIRIZINE HCL 10 MG PO TABS: 10.0000 mg | ORAL_TABLET | Freq: Every day | ORAL | 0 refills | Status: DC

## 2021-02-20 NOTE — Discharge Instructions (Addendum)
We will notify you of your COVID-19 test results as they arrive and may take between 48-72 hours.  I encourage you to sign up for MyChart if you have not already done so as this can be the easiest way for us to communicate results to you online or through a phone app.  Generally, we only contact you if it is a positive COVID result.  In the meantime, if you develop worsening symptoms including fever, chest pain, shortness of breath despite our current treatment plan then please report to the emergency room as this may be a sign of worsening status from possible COVID-19 infection.  Otherwise, we will manage this as a viral syndrome. For sore throat or cough try using a honey-based tea. Use 3 teaspoons of honey with juice squeezed from half lemon. Place shaved pieces of ginger into 1/2-1 cup of water and warm over stove top. Then mix the ingredients and repeat every 4 hours as needed. Please take Tylenol 500mg-650mg every 6 hours for aches and pains, fevers. Hydrate very well with at least 2 liters of water. Eat light meals such as soups to replenish electrolytes and soft fruits, veggies. Start an antihistamine like Zyrtec for postnasal drainage, sinus congestion.  

## 2021-02-20 NOTE — ED Provider Notes (Signed)
[facility]   MRN: [id number] DOB: 1955/09/08  Subjective:   Nicholas Brandt is a 65 y.o. male presenting for 5-day history of dry hacking cough, throat pain, malaise.  Has not used medications for relief.  Denies chest pain, shortness of breath, wheezing, history of respiratory disorders.  Patient is non-smoker.  He just had an outpatient surgery for his Achilles tendon.  Was not hospitalized.  Has remote history of bronchitis but states that this feels different.  Would like a COVID and flu test.   Current Facility-Administered Medications:    dexamethasone (DECADRON) injection 4 mg, 4 mg, Intra-articular, Once, [place], Kirstie Peri, DPM  Current Outpatient Medications:    aspirin 81 MG EC tablet, Take by mouth., Disp: , Rfl:    atorvastatin (LIPITOR) 20 MG tablet, Take 20 mg by mouth daily., Disp: , Rfl:    benzonatate (TESSALON) 200 MG capsule, Take 1 capsule (200 mg total) by mouth 3 (three) times daily as needed. (Patient not taking: No sig reported), Disp: 40 capsule, Rfl: 0   cyclobenzaprine (FLEXERIL) 10 MG tablet, Take 10 mg by mouth 3 (three) times daily as needed for muscle spasms., Disp: , Rfl:    diclofenac (VOLTAREN) 75 MG EC tablet, Take 1 tablet (75 mg total) by mouth 2 (two) times daily. (Patient not taking: No sig reported), Disp: 50 tablet, Rfl: 2   gabapentin (NEURONTIN) 300 MG capsule, Take 600 mg by mouth 3 (three) times daily., Disp: , Rfl:    Guaifenesin (MUCINEX MAXIMUM STRENGTH) 1200 MG TB12, Take 1 tablet (1,200 mg total) by mouth every 12 (twelve) hours as needed. (Patient not taking: No sig reported), Disp: 14 tablet, Rfl: 1   mirtazapine (REMERON) 15 MG tablet, Take by mouth. (Patient not taking: Reported on 05/27/2020), Disp: , Rfl:    Multiple Vitamin (MULTIVITAMIN) capsule, Take 1 capsule by mouth daily. Reported on 10/06/2015, Disp: , Rfl:    nabumetone (RELAFEN) 750 MG tablet, Take 750 mg by mouth 2 (two) times daily., Disp: , Rfl:     oxyCODONE (OXY IR/ROXICODONE) 5 MG immediate release tablet, Take 5 mg by mouth every 4 (four) hours as needed., Disp: , Rfl:    pantoprazole (PROTONIX) 40 MG tablet, Take 40 mg by mouth daily. (Patient not taking: No sig reported), Disp: , Rfl:    prazosin (MINIPRESS) 1 MG capsule, Take 1 mg by mouth at bedtime., Disp: , Rfl:    predniSONE (DELTASONE) 20 MG tablet, Take 1 tablet (20 mg total) by mouth daily with breakfast. (Patient not taking: No sig reported), Disp: 12 tablet, Rfl: 0   sildenafil (VIAGRA) 100 MG tablet, TAKE ONE TABLET BY MOUTH DAILY AS NEEDED FOR ERECTILE DYSFUNCTION. (Patient not taking: No sig reported), Disp: , Rfl:    traMADol (ULTRAM) 50 MG tablet, Take 1 tablet (50 mg total) by mouth every 6 (six) hours as needed for up to 10 doses for severe pain., Disp: 10 tablet, Rfl: 0   triamcinolone acetonide (KENALOG-40) 40 MG/ML injection (RADIOLOGY ONLY), Inject into the articular space., Disp: , Rfl:    valsartan-hydrochlorothiazide (DIOVAN-HCT) 80-12.5 MG tablet, Take 1 tablet by mouth daily., Disp: , Rfl:    No Known Allergies  Past Medical History:  Diagnosis Date   Hyperlipidemia    Hypertension      History reviewed. No pertinent surgical history.  History reviewed. No pertinent family history.  Social History   Tobacco Use   Smoking status: Never   Smokeless tobacco: Never  Substance Use Topics  Alcohol use: Not Currently   Drug use: Not Currently    ROS   Objective:   Vitals: BP (!) 144/100 (BP Location: Right Arm)   Pulse 85   Temp 98.5 F (36.9 C) (Oral)   Resp 20   SpO2 97%   Physical Exam Constitutional:      General: He is not in acute distress.    Appearance: Normal appearance. He is well-developed. He is not ill-appearing, toxic-appearing or diaphoretic.  HENT:     Head: Normocephalic and atraumatic.     Right Ear: External ear normal.     Left Ear: External ear normal.     Nose: Nose normal.     Mouth/Throat:     Mouth: Mucous  membranes are moist.     Pharynx: No oropharyngeal exudate or posterior oropharyngeal erythema.  Eyes:     General: No scleral icterus.       Right eye: No discharge.        Left eye: No discharge.     Extraocular Movements: Extraocular movements intact.     Conjunctiva/sclera: Conjunctivae normal.     Pupils: Pupils are equal, round, and reactive to light.  Cardiovascular:     Rate and Rhythm: Normal rate and regular rhythm.     Heart sounds: Normal heart sounds. No murmur heard.   No friction rub. No gallop.  Pulmonary:     Effort: Pulmonary effort is normal. No respiratory distress.     Breath sounds: Normal breath sounds. No stridor. No wheezing, rhonchi or rales.  Musculoskeletal:     Cervical back: Normal range of motion and neck supple. No tenderness.  Lymphadenopathy:     Cervical: No cervical adenopathy.  Neurological:     Mental Status: He is alert and oriented to person, place, and time.  Psychiatric:        Mood and Affect: Mood normal.        Behavior: Behavior normal.        Thought Content: Thought content normal.     Assessment and Plan :   PDMP not reviewed this encounter.  1. Viral URI with cough   2. Throat pain     Low suspicion for hospital-acquired pneumonia as it was an outpatient procedure.  Clear cardiopulmonary exam, therefore deferred imaging.  Recommended supportive care for viral upper respiratory infection.  COVID-19, flu testing pending. Counseled patient on potential for adverse effects with medications prescribed/recommended today, ER and return-to-clinic precautions discussed, patient verbalized understanding.    Wallis Bamberg, [place] 02/20/21 [phone]

## 2021-02-20 NOTE — ED Triage Notes (Addendum)
Pt reports having surgery last Friday (repair of achilles tendon) and now has a cough and sore throat.  Started: Monday

## 2021-02-21 LAB — COVID-19, FLU A+B NAA
Influenza A, NAA: NOT DETECTED
Influenza B, NAA: NOT DETECTED
SARS-CoV-2, NAA: DETECTED — AB

## 2021-05-21 ENCOUNTER — Other Ambulatory Visit: Payer: Self-pay | Admitting: Urology

## 2021-05-22 LAB — PSA: Prostate Specific Ag, Serum: 3.5 ng/mL (ref 0.0–4.0)

## 2021-05-24 DIAGNOSIS — N486 Induration penis plastica: Secondary | ICD-10-CM | POA: Insufficient documentation

## 2021-06-01 NOTE — Progress Notes (Signed)
This is the patient that I need you to call.  Thanks

## 2021-06-03 ENCOUNTER — Telehealth: Payer: Self-pay

## 2021-06-03 NOTE — Telephone Encounter (Signed)
Called patient to give PSA results. Informed patient that PSA was 3.5 and that we usually refer patients for FU for readings over 3.0. Patient is scheduled with his PCP Nicholas Brandt with [facility] on 06/29/21 for his yearly physical. Patient voiced understanding of results.

## 2021-11-03 DIAGNOSIS — M5412 Radiculopathy, cervical region: Secondary | ICD-10-CM | POA: Insufficient documentation

## 2021-12-28 DIAGNOSIS — G5603 Carpal tunnel syndrome, bilateral upper limbs: Secondary | ICD-10-CM | POA: Insufficient documentation

## 2022-02-07 ENCOUNTER — Ambulatory Visit
Admission: EM | Admit: 2022-02-07 | Discharge: 2022-02-07 | Disposition: A | Discharge status: Home / Self Care | Payer: Medicare Other | Attending: Family Medicine | Admitting: Family Medicine

## 2022-02-07 DIAGNOSIS — J069 Acute upper respiratory infection, unspecified: Secondary | ICD-10-CM

## 2022-02-07 DIAGNOSIS — Z20822 Contact with and (suspected) exposure to covid-19: Secondary | ICD-10-CM | POA: Diagnosis not present

## 2022-02-07 LAB — RESP PANEL BY RT-PCR (FLU A&B, COVID) ARPGX2
Influenza A by PCR: NEGATIVE
Influenza B by PCR: NEGATIVE
SARS Coronavirus 2 by RT PCR: NEGATIVE

## 2022-02-07 MED ORDER — ALBUTEROL SULFATE HFA 108 (90 BASE) MCG/ACT IN AERS: 1.0000 | INHALATION_SPRAY | Freq: Four times a day (QID) | RESPIRATORY_TRACT | 0 refills | Status: DC | PRN

## 2022-02-07 MED ORDER — PROMETHAZINE-DM 6.25-15 MG/5ML PO SYRP: 5.0000 mL | ORAL_SOLUTION | Freq: Four times a day (QID) | ORAL | 0 refills | Status: DC | PRN

## 2022-02-07 NOTE — ED Triage Notes (Signed)
Pt presents with c/o cough an wheezing for past few days, also has sore throat

## 2022-02-08 NOTE — ED Provider Notes (Signed)
[facility]    CSN: [id number] Arrival date & time: 02/07/22  0813      History   Chief Complaint No chief complaint on file.   HPI Nicholas Brandt is a 66 y.o. male.   Patient presenting today with several day history of cough, congestion, scratchy throat and started with nighttime wheezing yesterday.  Denies known fever, chills, chest pain, shortness of breath, abdominal pain, nausea vomiting or diarrhea.  So far not trying anything over-the-counter for symptoms apart from an antihistamine here and there.  No known sick contacts recently.  History of seasonal allergies on as needed antihistamines    Past Medical History:  Diagnosis Date   Hyperlipidemia    Hypertension     There are no problems to display for this patient.   History reviewed. No pertinent surgical history.     Home Medications    Prior to Admission medications   Medication Sig Start Date End Date Taking? Authorizing Provider  albuterol (VENTOLIN HFA) 108 (90 Base) MCG/ACT inhaler Inhale 1-2 puffs into the lungs every 6 (six) hours as needed for wheezing or shortness of breath. 02/07/22  Yes Particia Nearing, PA-C  promethazine-dextromethorphan (PROMETHAZINE-DM) 6.25-15 MG/5ML syrup Take 5 mLs by mouth 4 (four) times daily as needed. 02/07/22  Yes Particia Nearing, PA-C  aspirin 81 MG EC tablet Take by mouth.    [provider]  atorvastatin (LIPITOR) 20 MG tablet Take 20 mg by mouth daily.    [provider]  benzonatate (TESSALON) 100 MG capsule Take 1-2 capsules (100-200 mg total) by mouth 3 (three) times daily as needed for cough. 02/20/21   Wallis Bamberg, PA-C  cetirizine (ZYRTEC ALLERGY) 10 MG tablet Take 1 tablet (10 mg total) by mouth daily. 02/20/21   Wallis Bamberg, PA-C  cyclobenzaprine (FLEXERIL) 10 MG tablet Take 10 mg by mouth 3 (three) times daily as needed for muscle spasms.    [provider]  diclofenac (VOLTAREN) 75 MG EC tablet Take 1  tablet (75 mg total) by mouth 2 (two) times daily. Patient not taking: No sig reported 03/30/20   Lenn Sink, DPM  gabapentin (NEURONTIN) 300 MG capsule Take 600 mg by mouth 3 (three) times daily. 12/31/20   [provider]  Guaifenesin (MUCINEX MAXIMUM STRENGTH) 1200 MG TB12 Take 1 tablet (1,200 mg total) by mouth every 12 (twelve) hours as needed. Patient not taking: No sig reported 10/06/15   Sherren Mocha, MD  mirtazapine (REMERON) 15 MG tablet Take by mouth. Patient not taking: Reported on 05/27/2020    [provider]  Multiple Vitamin (MULTIVITAMIN) capsule Take 1 capsule by mouth daily. Reported on 10/06/2015    [provider]  nabumetone (RELAFEN) 750 MG tablet Take 750 mg by mouth 2 (two) times daily. 05/27/20   [provider]  oxyCODONE (OXY IR/ROXICODONE) 5 MG immediate release tablet Take 5 mg by mouth every 4 (four) hours as needed. 02/11/21   [provider]  pantoprazole (PROTONIX) 40 MG tablet Take 40 mg by mouth daily. Patient not taking: No sig reported 05/23/16   [provider]  prazosin (MINIPRESS) 1 MG capsule Take 1 mg by mouth at bedtime. 12/30/19   [provider]  predniSONE (DELTASONE) 20 MG tablet Take 1 tablet (20 mg total) by mouth daily with breakfast. Patient not taking: No sig reported 10/02/14   Peyton Najjar, MD  promethazine-dextromethorphan (PROMETHAZINE-DM) 6.25-15 MG/5ML syrup Take 5 mLs by mouth at bedtime as  needed for cough. 02/20/21   Jaynee Eagles, PA-C  sildenafil (VIAGRA) 100 MG tablet TAKE ONE TABLET BY MOUTH DAILY AS NEEDED FOR ERECTILE DYSFUNCTION. Patient not taking: No sig reported 05/30/16   [provider]  traMADol (ULTRAM) 50 MG tablet Take 1 tablet (50 mg total) by mouth every 6 (six) hours as needed for up to 10 doses for severe pain. 05/27/20   Luna Fuse, MD  triamcinolone acetonide (KENALOG-40) 40 MG/ML injection (RADIOLOGY ONLY) Inject into the articular space.  08/22/19   [provider]  valsartan-hydrochlorothiazide (DIOVAN-HCT) 80-12.5 MG tablet Take 1 tablet by mouth daily. 02/04/20   [provider]    Family History History reviewed. No pertinent family history.  Social History Social History   Tobacco Use   Smoking status: Never   Smokeless tobacco: Never  Substance Use Topics   Alcohol use: Not Currently   Drug use: Not Currently     Allergies   Patient has no known allergies.   Review of Systems Review of Systems Per HPI  Physical Exam Triage Vital Signs ED Triage Vitals [02/07/22 0838]  Enc Vitals Group     BP 127/82     Pulse Rate 88     Resp 20     Temp 98.5 F (36.9 C)     Temp src      SpO2 96 %     Weight      Height      Head Circumference      Peak Flow      Pain Score 0     Pain Loc      Pain Edu?      Excl. in [place]?    No data found.  Updated Vital Signs BP 127/82   Pulse 88   Temp 98.5 F (36.9 C)   Resp 20   SpO2 96%   Visual Acuity Right Eye Distance:   Left Eye Distance:   Bilateral Distance:    Right Eye Near:   Left Eye Near:    Bilateral Near:     Physical Exam Vitals and nursing note reviewed.  Constitutional:      Appearance: Normal appearance. He is well-developed.  HENT:     Head: Atraumatic.     Right Ear: External ear normal.     Left Ear: External ear normal.     Nose: Rhinorrhea present.     Mouth/Throat:     Pharynx: Posterior oropharyngeal erythema present. No oropharyngeal exudate.  Eyes:     Extraocular Movements: Extraocular movements intact.     Conjunctiva/sclera: Conjunctivae normal.     Pupils: Pupils are equal, round, and reactive to light.  Cardiovascular:     Rate and Rhythm: Normal rate and regular rhythm.  Pulmonary:     Effort: Pulmonary effort is normal. No respiratory distress.     Breath sounds: Normal breath sounds. No wheezing or rales.  Musculoskeletal:        General: Normal range of motion.     Cervical back: Normal  range of motion and neck supple.  Lymphadenopathy:     Cervical: No cervical adenopathy.  Skin:    General: Skin is warm and dry.  Neurological:     General: No focal deficit present.     Mental Status: He is alert and oriented to person, place, and time.  Psychiatric:        Mood and Affect: Mood normal.        Behavior: Behavior normal.  Thought Content: Thought content normal.        Judgment: Judgment normal.      UC Treatments / Results  Labs (all labs ordered are listed, but only abnormal results are displayed) Labs Reviewed  RESP PANEL BY RT-PCR (FLU A&B, COVID) [id number]    EKG   Radiology No results found.  Procedures Procedures (including critical care time)  Medications Ordered in UC Medications - No data to display  Initial Impression / Assessment and Plan / UC Course  I have reviewed the triage vital signs and the nursing notes.  Pertinent labs & imaging results that were available during my care of the patient were reviewed by me and considered in my medical decision making (see chart for details).     Vitals and exam reassuring, respiratory panel pending, suspect viral upper respiratory infection.  Given his nighttime wheezes, will treat with albuterol inhaler, cold and congestion medications, Phenergan DM.  Discussed supportive over-the-counter medications and home care.  Return for worsening symptoms.  Final Clinical Impressions(s) / UC Diagnoses   Final diagnoses:  Viral URI with cough   Discharge Instructions   None    ED Prescriptions     Medication Sig Dispense Auth. Provider   albuterol (VENTOLIN HFA) 108 (90 Base) MCG/ACT inhaler Inhale 1-2 puffs into the lungs every 6 (six) hours as needed for wheezing or shortness of breath. 18 g Roosvelt Maser [place], [place]   promethazine-dextromethorphan (PROMETHAZINE-DM) 6.25-15 MG/5ML syrup Take 5 mLs by mouth 4 (four) times daily as needed. 100 mL Particia Nearing, [place]      PDMP not  reviewed this encounter.   Particia Nearing, [place] 02/08/22 1059

## 2022-04-08 ENCOUNTER — Encounter: Payer: Self-pay | Admitting: Emergency Medicine

## 2022-04-08 ENCOUNTER — Ambulatory Visit
Admission: EM | Admit: 2022-04-08 | Discharge: 2022-04-08 | Disposition: A | Discharge status: Home / Self Care | Payer: PPO | Attending: Family Medicine | Admitting: Family Medicine

## 2022-04-08 DIAGNOSIS — J069 Acute upper respiratory infection, unspecified: Secondary | ICD-10-CM | POA: Diagnosis not present

## 2022-04-08 DIAGNOSIS — R509 Fever, unspecified: Secondary | ICD-10-CM | POA: Diagnosis not present

## 2022-04-08 DIAGNOSIS — U071 COVID-19: Secondary | ICD-10-CM | POA: Insufficient documentation

## 2022-04-08 LAB — RESP PANEL BY RT-PCR (FLU A&B, COVID) ARPGX2
Influenza A by PCR: NEGATIVE
Influenza B by PCR: NEGATIVE
SARS Coronavirus 2 by RT PCR: POSITIVE — AB

## 2022-04-08 MED ORDER — PROMETHAZINE-DM 6.25-15 MG/5ML PO SYRP: 5.0000 mL | ORAL_SOLUTION | Freq: Four times a day (QID) | ORAL | 0 refills | Status: DC | PRN

## 2022-04-08 MED ORDER — FLUTICASONE PROPIONATE 50 MCG/ACT NA SUSP: 1.0000 | Freq: Two times a day (BID) | NASAL | 2 refills | Status: DC

## 2022-04-08 NOTE — ED Triage Notes (Signed)
Cough, chest tightness, sore throat, fever, body aches, headache, x 2 days.

## 2022-04-08 NOTE — ED Provider Notes (Signed)
[facility]    CSN: [id number] Arrival date & time: 04/08/22  8341      History   Chief Complaint No chief complaint on file.   HPI Nicholas Brandt is a 66 y.o. male.   Patient presenting today with 2-day history of fever, chills, body aches, headache, sore throat, productive cough, nasal congestion.  Denies chest pain, shortness of breath, abdominal pain, nausea vomiting or diarrhea.  So far trying Phenergan DM leftover from a previous visit with mild temporary relief.  Wife sick with similar symptoms.    Past Medical History:  Diagnosis Date   Hyperlipidemia    Hypertension     There are no problems to display for this patient.   History reviewed. No pertinent surgical history.     Home Medications    Prior to Admission medications   Medication Sig Start Date End Date Taking? Authorizing Provider  fluticasone (FLONASE) 50 MCG/ACT nasal spray Place 1 spray into both nostrils 2 (two) times daily. 04/08/22  Yes Particia Nearing, PA-C  promethazine-dextromethorphan (PROMETHAZINE-DM) 6.25-15 MG/5ML syrup Take 5 mLs by mouth 4 (four) times daily as needed. 04/08/22  Yes Particia Nearing, PA-C  albuterol (VENTOLIN HFA) 108 (90 Base) MCG/ACT inhaler Inhale 1-2 puffs into the lungs every 6 (six) hours as needed for wheezing or shortness of breath. 02/07/22   Particia Nearing, PA-C  aspirin 81 MG EC tablet Take by mouth.    [provider]  atorvastatin (LIPITOR) 20 MG tablet Take 20 mg by mouth daily.    [provider]  benzonatate (TESSALON) 100 MG capsule Take 1-2 capsules (100-200 mg total) by mouth 3 (three) times daily as needed for cough. 02/20/21   Wallis Bamberg, PA-C  cetirizine (ZYRTEC ALLERGY) 10 MG tablet Take 1 tablet (10 mg total) by mouth daily. 02/20/21   Wallis Bamberg, PA-C  cyclobenzaprine (FLEXERIL) 10 MG tablet Take 10 mg by mouth 3 (three) times daily as needed for muscle spasms.    [provider]   diclofenac (VOLTAREN) 75 MG EC tablet Take 1 tablet (75 mg total) by mouth 2 (two) times daily. Patient not taking: No sig reported 03/30/20   Lenn Sink, DPM  gabapentin (NEURONTIN) 300 MG capsule Take 600 mg by mouth 3 (three) times daily. 12/31/20   [provider]  Guaifenesin (MUCINEX MAXIMUM STRENGTH) 1200 MG TB12 Take 1 tablet (1,200 mg total) by mouth every 12 (twelve) hours as needed. Patient not taking: No sig reported 10/06/15   Sherren Mocha, MD  mirtazapine (REMERON) 15 MG tablet Take by mouth. Patient not taking: Reported on 05/27/2020    [provider]  Multiple Vitamin (MULTIVITAMIN) capsule Take 1 capsule by mouth daily. Reported on 10/06/2015    [provider]  nabumetone (RELAFEN) 750 MG tablet Take 750 mg by mouth 2 (two) times daily. 05/27/20   [provider]  oxyCODONE (OXY IR/ROXICODONE) 5 MG immediate release tablet Take 5 mg by mouth every 4 (four) hours as needed. 02/11/21   [provider]  pantoprazole (PROTONIX) 40 MG tablet Take 40 mg by mouth daily. Patient not taking: No sig reported 05/23/16   [provider]  prazosin (MINIPRESS) 1 MG capsule Take 1 mg by mouth at bedtime. 12/30/19   [provider]  predniSONE (DELTASONE) 20 MG tablet Take 1 tablet (20 mg total) by mouth daily with breakfast. Patient not taking: No sig reported 10/02/14   Peyton Najjar, MD  promethazine-dextromethorphan (PROMETHAZINE-DM)  6.25-15 MG/5ML syrup Take 5 mLs by mouth at bedtime as needed for cough. 02/20/21   Wallis Bamberg, PA-C  promethazine-dextromethorphan (PROMETHAZINE-DM) 6.25-15 MG/5ML syrup Take 5 mLs by mouth 4 (four) times daily as needed. 02/07/22   Particia Nearing, PA-C  sildenafil (VIAGRA) 100 MG tablet TAKE ONE TABLET BY MOUTH DAILY AS NEEDED FOR ERECTILE DYSFUNCTION. Patient not taking: No sig reported 05/30/16   [provider]  traMADol (ULTRAM) 50 MG tablet Take 1 tablet (50 mg total) by mouth  every 6 (six) hours as needed for up to 10 doses for severe pain. 05/27/20   Cheryll Cockayne, MD  triamcinolone acetonide (KENALOG-40) 40 MG/ML injection (RADIOLOGY ONLY) Inject into the articular space. 08/22/19   [provider]  valsartan-hydrochlorothiazide (DIOVAN-HCT) 80-12.5 MG tablet Take 1 tablet by mouth daily. 02/04/20   [provider]    Family History History reviewed. No pertinent family history.  Social History Social History   Tobacco Use   Smoking status: Never   Smokeless tobacco: Never  Substance Use Topics   Alcohol use: Not Currently   Drug use: Not Currently     Allergies   Patient has no known allergies.   Review of Systems Review of Systems Per HPI  Physical Exam Triage Vital Signs ED Triage Vitals [04/08/22 0817]  Enc Vitals Group     BP 136/86     Pulse Rate 92     Resp 18     Temp 99.4 F (37.4 C)     Temp Source Oral     SpO2 95 %     Weight      Height      Head Circumference      Peak Flow      Pain Score 3     Pain Loc      Pain Edu?      Excl. in GC?    No data found.  Updated Vital Signs BP 136/86 (BP Location: Right Arm)   Pulse 92   Temp 99.4 F (37.4 C) (Oral)   Resp 18   SpO2 95%   Visual Acuity Right Eye Distance:   Left Eye Distance:   Bilateral Distance:    Right Eye Near:   Left Eye Near:    Bilateral Near:     Physical Exam Vitals and nursing note reviewed.  Constitutional:      Appearance: He is well-developed.  HENT:     Head: Atraumatic.     Right Ear: External ear normal.     Left Ear: External ear normal.     Nose: Rhinorrhea present.     Mouth/Throat:     Pharynx: Posterior oropharyngeal erythema present. No oropharyngeal exudate.  Eyes:     Conjunctiva/sclera: Conjunctivae normal.     Pupils: Pupils are equal, round, and reactive to light.  Cardiovascular:     Rate and Rhythm: Normal rate and regular rhythm.  Pulmonary:     Effort: Pulmonary effort is normal. No  respiratory distress.     Breath sounds: No wheezing or rales.  Musculoskeletal:        General: Normal range of motion.     Cervical back: Normal range of motion and neck supple.  Lymphadenopathy:     Cervical: No cervical adenopathy.  Skin:    General: Skin is warm and dry.  Neurological:     Mental Status: He is alert and oriented to person, place, and time.  Psychiatric:  Behavior: Behavior normal.      UC Treatments / Results  Labs (all labs ordered are listed, but only abnormal results are displayed) Labs Reviewed  RESP PANEL BY RT-PCR (FLU A&B, COVID) [id number]    EKG   Radiology No results found.  Procedures Procedures (including critical care time)  Medications Ordered in UC Medications - No data to display  Initial Impression / Assessment and Plan / UC Course  I have reviewed the triage vital signs and the nursing notes.  Pertinent labs & imaging results that were available during my care of the patient were reviewed by me and considered in my medical decision making (see chart for details).     Vitals and exam overall reassuring today and suggestive of a viral upper respiratory infection.  Respiratory panel pending, treat with Phenergan DM, Flonase and good candidate for antiviral therapy either Tamiflu or molnupiravir if positive for COVID or flu.  Return for worsening symptoms.  Final Clinical Impressions(s) / UC Diagnoses   Final diagnoses:  Viral URI with cough  Fever, unspecified   Discharge Instructions   None    ED Prescriptions     Medication Sig Dispense Auth. Provider   promethazine-dextromethorphan (PROMETHAZINE-DM) 6.25-15 MG/5ML syrup Take 5 mLs by mouth 4 (four) times daily as needed. 100 mL Particia Nearing, PA-C   fluticasone [facility]) 50 MCG/ACT nasal spray Place 1 spray into both nostrils 2 (two) times daily. 16 g Particia Nearing, [place]      PDMP not reviewed this encounter.   Particia Nearing,  [place] 04/08/22 [phone]

## 2022-04-11 ENCOUNTER — Telehealth (HOSPITAL_COMMUNITY): Payer: Self-pay | Admitting: Emergency Medicine

## 2022-04-11 MED ORDER — MOLNUPIRAVIR EUA 200MG CAPSULE: 4.0000 | ORAL_CAPSULE | Freq: Two times a day (BID) | ORAL | 0 refills | Status: AC

## 2022-04-11 MED ORDER — MOLNUPIRAVIR EUA 200MG CAPSULE: 4.0000 | ORAL_CAPSULE | Freq: Two times a day (BID) | ORAL | 0 refills | Status: DC

## 2022-04-11 NOTE — Telephone Encounter (Signed)
Patient needed prescription sent elsewhere, states all [place] pharmacies no longer carry it 

## 2022-06-08 DIAGNOSIS — Z9889 Other specified postprocedural states: Secondary | ICD-10-CM | POA: Insufficient documentation

## 2022-08-20 LAB — AMB RESULTS CONSOLE CBG: Glucose: 108

## 2022-09-12 DIAGNOSIS — Z8249 Family history of ischemic heart disease and other diseases of the circulatory system: Secondary | ICD-10-CM | POA: Insufficient documentation

## 2022-09-12 DIAGNOSIS — R9431 Abnormal electrocardiogram [ECG] [EKG]: Secondary | ICD-10-CM | POA: Insufficient documentation

## 2023-02-11 ENCOUNTER — Encounter: Payer: Self-pay | Admitting: Emergency Medicine

## 2023-02-11 ENCOUNTER — Ambulatory Visit
Admission: EM | Admit: 2023-02-11 | Discharge: 2023-02-11 | Disposition: A | Discharge status: Home / Self Care | Payer: PPO | Attending: Family Medicine | Admitting: Family Medicine

## 2023-02-11 ENCOUNTER — Other Ambulatory Visit: Payer: Self-pay

## 2023-02-11 DIAGNOSIS — J069 Acute upper respiratory infection, unspecified: Secondary | ICD-10-CM | POA: Insufficient documentation

## 2023-02-11 DIAGNOSIS — Z1152 Encounter for screening for COVID-19: Secondary | ICD-10-CM | POA: Diagnosis not present

## 2023-02-11 MED ORDER — PREDNISONE 20 MG PO TABS: 40.0000 mg | ORAL_TABLET | Freq: Every day | ORAL | 0 refills | Status: DC

## 2023-02-11 MED ORDER — PROMETHAZINE-DM 6.25-15 MG/5ML PO SYRP: 5.0000 mL | ORAL_SOLUTION | Freq: Four times a day (QID) | ORAL | 0 refills | Status: DC | PRN

## 2023-02-11 NOTE — Discharge Instructions (Signed)
Your COVID test to be back tomorrow and we will call if positive to discuss antiviral medications.  In the meantime, we will treat with prednisone, cough syrup and over-the-counter cold and congestion medications.

## 2023-02-11 NOTE — ED Triage Notes (Signed)
Pt reports productive cough, intermittent headache x1 week. Pt reports bilateral rib pain for last few days.

## 2023-02-12 LAB — SARS CORONAVIRUS 2 (TAT 6-24 HRS): SARS Coronavirus 2: NEGATIVE

## 2023-02-15 NOTE — ED Provider Notes (Signed)
[facility]    CSN: [id number] Arrival date & time: 02/11/23  1422      History   Chief Complaint Chief Complaint  Patient presents with   Cough    HPI Nicholas Brandt is a 67 y.o. male.   Patient presenting today with 1 week history of productive cough, headache, chest tightness.  Denies fever, chills, body aches, abdominal pain, nausea vomiting diarrhea, congestion, sore throat.  So far trying over-the-counter remedies with minimal relief.  No known history of chronic pulmonary disease.  No known sick contacts recently.    Past Medical History:  Diagnosis Date   Hyperlipidemia    Hypertension     There are no problems to display for this patient.   Past Surgical History:  Procedure Laterality Date   carpal tunnel release Left    2024 January       Home Medications    Prior to Admission medications   Medication Sig Start Date End Date Taking? Authorizing Provider  predniSONE (DELTASONE) 20 MG tablet Take 2 tablets (40 mg total) by mouth daily with breakfast. 02/11/23  Yes Particia Nearing, PA-C  albuterol (VENTOLIN HFA) 108 (90 Base) MCG/ACT inhaler Inhale 1-2 puffs into the lungs every 6 (six) hours as needed for wheezing or shortness of breath. 02/07/22   Particia Nearing, PA-C  aspirin 81 MG EC tablet Take by mouth.    [provider]  atorvastatin (LIPITOR) 20 MG tablet Take 20 mg by mouth daily.    [provider]  benzonatate (TESSALON) 100 MG capsule Take 1-2 capsules (100-200 mg total) by mouth 3 (three) times daily as needed for cough. 02/20/21   Wallis Bamberg, PA-C  cetirizine (ZYRTEC ALLERGY) 10 MG tablet Take 1 tablet (10 mg total) by mouth daily. 02/20/21   Wallis Bamberg, PA-C  cyclobenzaprine (FLEXERIL) 10 MG tablet Take 10 mg by mouth 3 (three) times daily as needed for muscle spasms.    [provider]  diclofenac (VOLTAREN) 75 MG EC tablet Take 1 tablet (75 mg total) by mouth 2 (two) times  daily. Patient not taking: No sig reported 03/30/20   Lenn Sink, DPM  fluticasone (FLONASE) 50 MCG/ACT nasal spray Place 1 spray into both nostrils 2 (two) times daily. 04/08/22   Particia Nearing, PA-C  gabapentin (NEURONTIN) 300 MG capsule Take 600 mg by mouth 3 (three) times daily. 12/31/20   [provider]  Guaifenesin (MUCINEX MAXIMUM STRENGTH) 1200 MG TB12 Take 1 tablet (1,200 mg total) by mouth every 12 (twelve) hours as needed. Patient not taking: No sig reported 10/06/15   Sherren Mocha, MD  mirtazapine (REMERON) 15 MG tablet Take by mouth. Patient not taking: Reported on 05/27/2020    [provider]  Multiple Vitamin (MULTIVITAMIN) capsule Take 1 capsule by mouth daily. Reported on 10/06/2015    [provider]  nabumetone (RELAFEN) 750 MG tablet Take 750 mg by mouth 2 (two) times daily. 05/27/20   [provider]  oxyCODONE (OXY IR/ROXICODONE) 5 MG immediate release tablet Take 5 mg by mouth every 4 (four) hours as needed. 02/11/21   [provider]  pantoprazole (PROTONIX) 40 MG tablet Take 40 mg by mouth daily. Patient not taking: No sig reported 05/23/16   [provider]  prazosin (MINIPRESS) 1 MG capsule Take 1 mg by mouth at bedtime. 12/30/19   [provider]  predniSONE (DELTASONE) 20 MG tablet Take 1 tablet (20 mg total) by mouth daily  with breakfast. Patient not taking: No sig reported 10/02/14   Peyton Najjar, MD  promethazine-dextromethorphan (PROMETHAZINE-DM) 6.25-15 MG/5ML syrup Take 5 mLs by mouth at bedtime as needed for cough. 02/20/21   Wallis Bamberg, PA-C  promethazine-dextromethorphan (PROMETHAZINE-DM) 6.25-15 MG/5ML syrup Take 5 mLs by mouth 4 (four) times daily as needed. 04/08/22   Particia Nearing, PA-C  promethazine-dextromethorphan (PROMETHAZINE-DM) 6.25-15 MG/5ML syrup Take 5 mLs by mouth 4 (four) times daily as needed. 02/11/23   Particia Nearing, PA-C  sildenafil (VIAGRA) 100 MG  tablet TAKE ONE TABLET BY MOUTH DAILY AS NEEDED FOR ERECTILE DYSFUNCTION. Patient not taking: No sig reported 05/30/16   [provider]  traMADol (ULTRAM) 50 MG tablet Take 1 tablet (50 mg total) by mouth every 6 (six) hours as needed for up to 10 doses for severe pain. 05/27/20   Cheryll Cockayne, MD  triamcinolone acetonide (KENALOG-40) 40 MG/ML injection (RADIOLOGY ONLY) Inject into the articular space. 08/22/19   [provider]  valsartan-hydrochlorothiazide (DIOVAN-HCT) 80-12.5 MG tablet Take 1 tablet by mouth daily. 02/04/20   [provider]    Family History History reviewed. No pertinent family history.  Social History Social History   Tobacco Use   Smoking status: Never   Smokeless tobacco: Never  Substance Use Topics   Alcohol use: Not Currently   Drug use: Not Currently     Allergies   Patient has no known allergies.   Review of Systems Review of Systems Per HPI  Physical Exam Triage Vital Signs ED Triage Vitals [02/11/23 1528]  Encounter Vitals Group     BP 120/82     Systolic BP Percentile      Diastolic BP Percentile      Pulse Rate 78     Resp 20     Temp 99.5 F (37.5 C)     Temp Source Oral     SpO2 97 %     Weight      Height      Head Circumference      Peak Flow      Pain Score 6     Pain Loc      Pain Education      Exclude from Growth Chart    No data found.  Updated Vital Signs BP 120/82 (BP Location: Right Arm)   Pulse 78   Temp 99.5 F (37.5 C) (Oral)   Resp 20   SpO2 97%   Visual Acuity Right Eye Distance:   Left Eye Distance:   Bilateral Distance:    Right Eye Near:   Left Eye Near:    Bilateral Near:     Physical Exam Vitals and nursing note reviewed.  Constitutional:      Appearance: He is well-developed.  HENT:     Head: Atraumatic.     Right Ear: External ear normal.     Left Ear: External ear normal.     Nose: Rhinorrhea present.     Mouth/Throat:     Pharynx: Posterior  oropharyngeal erythema present. No oropharyngeal exudate.  Eyes:     Conjunctiva/sclera: Conjunctivae normal.     Pupils: Pupils are equal, round, and reactive to light.  Cardiovascular:     Rate and Rhythm: Normal rate and regular rhythm.  Pulmonary:     Effort: Pulmonary effort is normal. No respiratory distress.     Breath sounds: No wheezing or rales.  Musculoskeletal:        General: Normal range of motion.  Cervical back: Normal range of motion and neck supple.  Lymphadenopathy:     Cervical: No cervical adenopathy.  Skin:    General: Skin is warm and dry.  Neurological:     Mental Status: He is alert and oriented to person, place, and time.  Psychiatric:        Behavior: Behavior normal.      UC Treatments / Results  Labs (all labs ordered are listed, but only abnormal results are displayed) Labs Reviewed  SARS CORONAVIRUS 2 (TAT 6-24 HRS)    EKG   Radiology No results found.  Procedures Procedures (including critical care time)  Medications Ordered in UC Medications - No data to display  Initial Impression / Assessment and Plan / UC Course  I have reviewed the triage vital signs and the nursing notes.  Pertinent labs & imaging results that were available during my care of the patient were reviewed by me and considered in my medical decision making (see chart for details).     Vitals and exam overall reassuring and suspicious for viral sinusitis.  Treat with prednisone, Phenergan DM, supportive over-the-counter medications at home care.  Return for worsening symptoms.  Final Clinical Impressions(s) / UC Diagnoses   Final diagnoses:  Viral URI with cough     Discharge Instructions      Your COVID test to be back tomorrow and we will call if positive to discuss antiviral medications.  In the meantime, we will treat with prednisone, cough syrup and over-the-counter cold and congestion medications.    ED Prescriptions     Medication Sig  Dispense Auth. Provider   predniSONE (DELTASONE) 20 MG tablet Take 2 tablets (40 mg total) by mouth daily with breakfast. 10 tablet Particia Nearing, PA-C   promethazine-dextromethorphan (PROMETHAZINE-DM) 6.25-15 MG/5ML syrup Take 5 mLs by mouth 4 (four) times daily as needed. 100 mL Particia Nearing, [place]      PDMP not reviewed this encounter.   Particia Nearing, [place] 02/15/23 [phone]

## 2023-02-24 ENCOUNTER — Encounter: Payer: Self-pay | Admitting: Internal Medicine

## 2023-02-24 ENCOUNTER — Ambulatory Visit (INDEPENDENT_AMBULATORY_CARE_PROVIDER_SITE_OTHER): Payer: PPO | Admitting: Internal Medicine

## 2023-02-24 VITALS — Ht 68.0 in

## 2023-02-24 DIAGNOSIS — Z23 Encounter for immunization: Secondary | ICD-10-CM | POA: Insufficient documentation

## 2023-02-24 DIAGNOSIS — I10 Essential (primary) hypertension: Secondary | ICD-10-CM | POA: Diagnosis not present

## 2023-02-24 DIAGNOSIS — E782 Mixed hyperlipidemia: Secondary | ICD-10-CM

## 2023-02-24 DIAGNOSIS — I517 Cardiomegaly: Secondary | ICD-10-CM | POA: Insufficient documentation

## 2023-02-24 DIAGNOSIS — Z800 Family history of malignant neoplasm of digestive organs: Secondary | ICD-10-CM | POA: Diagnosis not present

## 2023-02-24 DIAGNOSIS — E785 Hyperlipidemia, unspecified: Secondary | ICD-10-CM | POA: Insufficient documentation

## 2023-02-24 NOTE — Assessment & Plan Note (Signed)
Nicholas Brandt mother had colon cancer.  He has undergone regular screening colonoscopies since his 30s.  Last completed in March 2022.  He is due for repeat colonoscopy in March 2027.

## 2023-02-24 NOTE — Assessment & Plan Note (Signed)
Adequately controlled on current antihypertensive regimen consisting of Hyzaar 50-12.5 mg daily and Toprol-XL 25 mg daily.  No medication changes are indicated today.

## 2023-02-24 NOTE — Assessment & Plan Note (Signed)
Influenza vaccine administered today.

## 2023-02-24 NOTE — Progress Notes (Signed)
New Patient Office Visit  Subjective    Patient ID: Nicholas Brandt, male    DOB: April 10, 1956  Age: 67 y.o. MRN: [id number]  CC:  Chief Complaint  Patient presents with   Establish Care   Cough    This has been going on for a few weeks    HPI Nicholas Brandt presents to establish care.  He is a 67 year old male who endorses a past medical history significant for HTN, HLD, ED, and multiple orthopedic injuries.  Previously followed by Dr. Cyndia Brandt with [facility].  Nicholas Brandt reports feeling well today.  He is asymptomatic and has no acute concerns to discuss aside from desiring to establish care.  He is a retired Medical laboratory scientific officer.  Denies any history of tobacco, alcohol, and illicit drug use.  His family medical history is significant for HTN, HLD, and colon cancer in his mother.  Chronic medical conditions and outstanding preventative care items discussed today are individually addressed in A/P below.  Outpatient Encounter Medications as of 02/24/2023  Medication Sig   aspirin 81 MG EC tablet Take by mouth.   atorvastatin (LIPITOR) 20 MG tablet Take 20 mg by mouth daily.   cyclobenzaprine (FLEXERIL) 10 MG tablet Take 10 mg by mouth 3 (three) times daily as needed for muscle spasms.   diclofenac (VOLTAREN) 75 MG EC tablet Take 1 tablet (75 mg total) by mouth 2 (two) times daily.   metoprolol succinate (TOPROL-XL) 25 MG 24 hr tablet Take 25 mg by mouth daily.   sildenafil (VIAGRA) 100 MG tablet TAKE ONE TABLET BY MOUTH DAILY AS NEEDED FOR ERECTILE DYSFUNCTION.   [DISCONTINUED] albuterol (VENTOLIN HFA) 108 (90 Base) MCG/ACT inhaler Inhale 1-2 puffs into the lungs every 6 (six) hours as needed for wheezing or shortness of breath.   [DISCONTINUED] benzonatate (TESSALON) 100 MG capsule Take 1-2 capsules (100-200 mg total) by mouth 3 (three) times daily as needed for cough.   [DISCONTINUED] cetirizine (ZYRTEC ALLERGY) 10 MG tablet Take 1 tablet (10 mg total) by mouth  daily.   [DISCONTINUED] fluticasone (FLONASE) 50 MCG/ACT nasal spray Place 1 spray into both nostrils 2 (two) times daily.   [DISCONTINUED] gabapentin (NEURONTIN) 300 MG capsule Take 600 mg by mouth 3 (three) times daily.   [DISCONTINUED] Guaifenesin (MUCINEX MAXIMUM STRENGTH) 1200 MG TB12 Take 1 tablet (1,200 mg total) by mouth every 12 (twelve) hours as needed.   [DISCONTINUED] mirtazapine (REMERON) 15 MG tablet Take by mouth.   [DISCONTINUED] Multiple Vitamin (MULTIVITAMIN) capsule Take 1 capsule by mouth daily. Reported on 10/06/2015   [DISCONTINUED] nabumetone (RELAFEN) 750 MG tablet Take 750 mg by mouth 2 (two) times daily.   [DISCONTINUED] oxyCODONE (OXY IR/ROXICODONE) 5 MG immediate release tablet Take 5 mg by mouth every 4 (four) hours as needed.   [DISCONTINUED] pantoprazole (PROTONIX) 40 MG tablet Take 40 mg by mouth daily.   [DISCONTINUED] prazosin (MINIPRESS) 1 MG capsule Take 1 mg by mouth at bedtime.   [DISCONTINUED] predniSONE (DELTASONE) 20 MG tablet Take 1 tablet (20 mg total) by mouth daily with breakfast.   [DISCONTINUED] predniSONE (DELTASONE) 20 MG tablet Take 2 tablets (40 mg total) by mouth daily with breakfast.   [DISCONTINUED] promethazine-dextromethorphan (PROMETHAZINE-DM) 6.25-15 MG/5ML syrup Take 5 mLs by mouth at bedtime as needed for cough.   [DISCONTINUED] promethazine-dextromethorphan (PROMETHAZINE-DM) 6.25-15 MG/5ML syrup Take 5 mLs by mouth 4 (four) times daily as needed.   [DISCONTINUED] promethazine-dextromethorphan (PROMETHAZINE-DM) 6.25-15 MG/5ML syrup Take 5 mLs by mouth 4 (four) times  daily as needed.   [DISCONTINUED] traMADol (ULTRAM) 50 MG tablet Take 1 tablet (50 mg total) by mouth every 6 (six) hours as needed for up to 10 doses for severe pain.   [DISCONTINUED] triamcinolone acetonide (KENALOG-40) 40 MG/ML injection (RADIOLOGY ONLY) Inject into the articular space.   [DISCONTINUED] valsartan-hydrochlorothiazide (DIOVAN-HCT) 80-12.5 MG tablet Take 1 tablet  by mouth daily.   [DISCONTINUED] valsartan-hydrochlorothiazide (DIOVAN-HCT) 80-12.5 MG tablet Take 1 tablet by mouth daily.   [DISCONTINUED] dexamethasone (DECADRON) injection 4 mg    No facility-administered encounter medications on file as of 02/24/2023.    Past Medical History:  Diagnosis Date   Hyperlipidemia    Hypertension     Past Surgical History:  Procedure Laterality Date   carpal tunnel release Left    2024 January    History reviewed. No pertinent family history.  Social History   Socioeconomic History   Marital status: Married    Spouse name: Not on file   Number of children: Not on file   Years of education: Not on file   Highest education level: Not on file  Occupational History   Not on file  Tobacco Use   Smoking status: Never   Smokeless tobacco: Never  Substance and Sexual Activity   Alcohol use: Not Currently   Drug use: Not Currently   Sexual activity: Not on file  Other Topics Concern   Not on file  Social History Narrative   Not on file   Social Determinants of Health   Financial Resource Strain: Low Risk  (08/28/2022)   [facility]   Overall Financial Resource Strain (CARDIA)    Difficulty of Paying Living Expenses: Not hard at all  Food Insecurity: No Food Insecurity (08/28/2022)   Received from [facility]   Hunger Vital Sign    Worried About Running Out of Food in the Last Year: Never true    Ran Out of Food in the Last Year: Never true  Transportation Needs: No Transportation Needs (08/28/2022)   Received from [facility] - Transportation    Lack of Transportation (Medical): No    Lack of Transportation (Non-Medical): No  Physical Activity: Insufficiently Active (08/28/2022)   Received from [facility]   Exercise Vital Sign    Days of Exercise per Week: 3 days    Minutes of Exercise per Session: 40 min  Stress: No Stress Concern Present (08/28/2022)   Received from [facility] of Occupational Health - Occupational Stress Questionnaire    Feeling of Stress : Not at all  Recent Concern: Stress - Stress Concern Present (06/06/2022)   Received from [facility], [facility] of Occupational Health - Occupational Stress Questionnaire    Feeling of Stress : To some extent  Social Connections: Moderately Integrated (06/06/2022)   Received from [facility], [facility]   Social Network    How would you rate your social network (family, work, friends)?: Adequate participation with social networks  Intimate Partner Violence: Not At Risk (08/28/2022)   Received from [facility]   HITS    Over the last 12 months how often did your partner physically hurt you?: 1    Over the last 12 months how often did your partner insult you or talk down to you?: 2    Over the last 12 months how often did your partner threaten you with physical harm?: 1    Over the last 12 months  how often did your partner scream or curse at you?: 1   Review of Systems  Constitutional:  Negative for chills and fever.  HENT:  Negative for sore throat.   Respiratory:  Positive for cough (Persistent, gradually improving). Negative for shortness of breath.   Cardiovascular:  Negative for chest pain, palpitations and leg swelling.  Gastrointestinal:  Negative for abdominal pain, blood in stool, constipation, diarrhea, nausea and vomiting.  Genitourinary:  Negative for dysuria and hematuria.  Musculoskeletal:  Negative for myalgias.  Skin:  Negative for itching and rash.  Neurological:  Negative for dizziness and headaches.  Psychiatric/Behavioral:  Negative for depression and suicidal ideas.    Objective    Ht 5\' 8"  (1.727 m)   BMI 34.97 kg/m   Physical Exam Vitals reviewed.  Constitutional:      General: He is not in acute distress.    Appearance: Normal appearance. He is obese. He is not ill-appearing.  HENT:     Head: Normocephalic and atraumatic.     Right  Ear: External ear normal.     Left Ear: External ear normal.     Nose: Nose normal. No congestion or rhinorrhea.     Mouth/Throat:     Mouth: Mucous membranes are moist.     Pharynx: Oropharynx is clear.  Eyes:     General: No scleral icterus.    Extraocular Movements: Extraocular movements intact.     Conjunctiva/sclera: Conjunctivae normal.     Pupils: Pupils are equal, round, and reactive to light.  Cardiovascular:     Rate and Rhythm: Normal rate and regular rhythm.     Pulses: Normal pulses.     Heart sounds: Normal heart sounds. No murmur heard. Pulmonary:     Effort: Pulmonary effort is normal.     Breath sounds: Normal breath sounds. No wheezing, rhonchi or rales.  Abdominal:     General: Abdomen is flat. Bowel sounds are normal. There is no distension.     Palpations: Abdomen is soft.     Tenderness: There is no abdominal tenderness.  Musculoskeletal:        General: No swelling or deformity. Normal range of motion.     Cervical back: Normal range of motion.  Skin:    General: Skin is warm and dry.     Capillary Refill: Capillary refill takes less than 2 seconds.  Neurological:     General: No focal deficit present.     Mental Status: He is alert and oriented to person, place, and time.     Motor: No weakness.  Psychiatric:        Mood and Affect: Mood normal.        Behavior: Behavior normal.        Thought Content: Thought content normal.    Assessment & Plan:   Problem List Items Addressed This Visit       Essential hypertension - Primary    Adequately controlled on current antihypertensive regimen consisting of Hyzaar 50-12.5 mg daily and Toprol-XL 25 mg daily.  No medication changes are indicated today.      Cardiomegaly    Evaluated by cardiology in May 2024 in the setting of cardiomegaly noted on chest x-ray.  ASA 81 mg daily and Toprol-XL 25 mg daily were started.      Hyperlipidemia    Lipid panel updated in April 2024, adequately controlled.  He  is currently prescribed atorvastatin 20 mg daily.  No medication changes are indicated today.  Family history of colon cancer in mother    Nicholas Brandt's mother had colon cancer.  He has undergone regular screening colonoscopies since his 30s.  Last completed in March 2022.  He is due for repeat colonoscopy in March 2027.      Need for influenza vaccination    Influenza vaccine administered today      Return in about 6 months (around 08/25/2023) for CPE.   Billie Lade, MD

## 2023-02-24 NOTE — Patient Instructions (Signed)
It was a pleasure to see you today.  Thank you for giving [place] the opportunity to be involved in your care.  Below is a brief recap of your visit and next steps.  We will plan to see you again in 6 months.  Summary You have established care today No medication changes were made We will plan for follow up in 6 months for your annual physical

## 2023-02-24 NOTE — Assessment & Plan Note (Signed)
Lipid panel updated in April 2024, adequately controlled.  He is currently prescribed atorvastatin 20 mg daily.  No medication changes are indicated today.

## 2023-02-24 NOTE — Assessment & Plan Note (Signed)
Evaluated by cardiology in May 2024 in the setting of cardiomegaly noted on chest x-ray.  ASA 81 mg daily and Toprol-XL 25 mg daily were started.

## 2023-07-05 ENCOUNTER — Ambulatory Visit (INDEPENDENT_AMBULATORY_CARE_PROVIDER_SITE_OTHER): Payer: PPO

## 2023-07-05 VITALS — Ht 68.0 in | Wt 230.0 lb

## 2023-07-05 DIAGNOSIS — Z0000 Encounter for general adult medical examination without abnormal findings: Secondary | ICD-10-CM

## 2023-07-05 DIAGNOSIS — Z0100 Encounter for examination of eyes and vision without abnormal findings: Secondary | ICD-10-CM

## 2023-07-05 NOTE — Patient Instructions (Signed)
 Mr. Adduci , Thank you for taking time to come for your Medicare Wellness Visit. I appreciate your ongoing commitment to your health goals. Please review the following plan we discussed and let me know if I can assist you in the future.   Referrals/Orders/Follow-Ups/Clinician Recommendations:  Next Medicare Annual Wellness Visit:   July 09, 2024 at 11:20 am telephone visit.  [facility] Barefoot [address] [place] [address] [facility]: [phone]   Elvera Bicker been referred to [facility]. You can call them to schedule your appointment Address: [address], [place], [place] [address] Phone: [phone]  This is a list of the screening recommended for you and due dates:  Health Maintenance  Topic Date Due   Flu Shot  12/08/2022   COVID-19 Vaccine (6 - 2024-25 season) 01/08/2023   DTaP/Tdap/Td vaccine (1 - Tdap) 02/24/2024*   Medicare Annual Wellness Visit  07/04/2024   Colon Cancer Screening  07/14/2025   Pneumonia Vaccine  Completed   Hepatitis C Screening  Completed   Zoster (Shingles) Vaccine  Completed   HPV Vaccine  Aged Out  *Topic was postponed. The date shown is not the original due date.    Advanced directives: (Declined) Advance directive discussed with you today. Even though you declined this today, please call our office should you change your mind, and we can give you the proper paperwork for you to fill out.  Next Medicare Annual Wellness Visit scheduled for next year: yes  Understanding Your Risk for Falls Millions of people have serious injuries from falls each year. It is important to understand your risk of falling. Talk with your health care provider about your risk and what you can do to lower it. If you do have a serious fall, make sure to tell your provider. Falling once raises your risk of falling again. How can falls affect me? Serious injuries from falls are common. These include: Broken bones, such as hip fractures. Head injuries, such  as traumatic brain injuries (TBI) or concussions. A fear of falling can cause you to avoid activities and stay at home. This can make your muscles weaker and raise your risk for a fall. What can increase my risk? There are a number of risk factors that increase your risk for falling. The more risk factors you have, the higher your risk of falling. Serious injuries from a fall happen most often to people who are older than 67 years old. Teenagers and young adults ages 46-29 are also at higher risk. Common risk factors include: Weakness in the lower body. Being generally weak or confused due to long-term (chronic) illness. Dizziness or balance problems. Poor vision. Medicines that cause dizziness or drowsiness. These may include: Medicines for your blood pressure, heart, anxiety, insomnia, or swelling (edema). Pain medicines. Muscle relaxants. Other risk factors include: Drinking alcohol. Having had a fall in the past. Having foot pain or wearing improper footwear. Working at a dangerous job. Having any of the following in your home: Tripping hazards, such as floor clutter or loose rugs. Poor lighting. Pets. Having dementia or memory loss. What actions can I take to lower my risk of falling?     Physical activity Stay physically fit. Do strength and balance exercises. Consider taking a regular class to build strength and balance. Yoga and tai chi are good options. Vision Have your eyes checked every year and your prescription for glasses or contacts updated as needed. Shoes and walking aids Wear non-skid shoes. Wear shoes that  have rubber soles and low heels. Do not wear high heels. Do not walk around the house in socks or slippers. Use a cane or walker as told by your provider. Home safety Attach secure railings on both sides of your stairs. Install grab bars for your bathtub, shower, and toilet. Use a non-skid mat in your bathtub or shower. Attach bath mats securely with  double-sided, non-slip rug tape. Use good lighting in all rooms. Keep a flashlight near your bed. Make sure there is a clear path from your bed to the bathroom. Use night-lights. Do not use throw rugs. Make sure all carpeting is taped or tacked down securely. Remove all clutter from walkways and stairways, including extension cords. Repair uneven or broken steps and floors. Avoid walking on icy or slippery surfaces. Walk on the grass instead of on icy or slick sidewalks. Use ice melter to get rid of ice on walkways in the winter. Use a cordless phone. Questions to ask your health care provider Can you help me check my risk for a fall? Do any of my medicines make me more likely to fall? Should I take a vitamin D supplement? What exercises can I do to improve my strength and balance? Should I make an appointment to have my vision checked? Do I need a bone density test to check for weak bones (osteoporosis)? Would it help to use a cane or a walker? Where to find more information Centers for Disease Control and Prevention, [employer]: TonerPromos.no Community-Based Fall Prevention Programs: TonerPromos.no [employer] on Aging: BaseRingTones.pl Contact a health care provider if: You fall at home. You are afraid of falling at home. You feel weak, drowsy, or dizzy. This information is not intended to replace advice given to you by your health care provider. Make sure you discuss any questions you have with your health care provider. Document Revised: 12/27/2021 Document Reviewed: 12/27/2021 Elsevier Patient Education  2024 [employer].

## 2023-07-05 NOTE — Progress Notes (Signed)
 Because this visit was a virtual/telehealth visit,  certain criteria was not obtained, such a blood pressure, CBG if applicable, and timed get up and go. Any medications not marked as "taking" were not mentioned during the medication reconciliation part of the visit. Any vitals not documented were not able to be obtained due to this being a telehealth visit or patient was unable to self-report a recent blood pressure reading due to a lack of equipment at home via telehealth. Vitals that have been documented are verbally provided by the patient.  Please attest and cosign this visit due to patients primary care provider not being in the office at the time the visit was completed.   Subjective:   Nicholas Brandt is a 68 y.o. who presents for a Medicare Wellness preventive visit.  Visit Complete: Virtual I connected with  Nicholas Brandt on 07/05/23 by a audio enabled telemedicine application and verified that I am speaking with the correct person using two identifiers.  Patient Location: Home  Provider Location: Home Office  I discussed the limitations of evaluation and management by telemedicine. The patient expressed understanding and agreed to proceed.  Vital Signs: Because this visit was a virtual/telehealth visit, some criteria may be missing or patient reported. Any vitals not documented were not able to be obtained and vitals that have been documented are patient reported.  VideoDeclined- This patient declined Librarian, academic. Therefore the visit was completed with audio only.  AWV Questionnaire: No: Patient Medicare AWV questionnaire was not completed prior to this visit.  Cardiac Risk Factors include: advanced age (>67men, >53 women);dyslipidemia;hypertension;male gender;obesity (BMI >30kg/m2);sedentary lifestyle     Objective:    Today's Vitals   07/05/23 1342  Weight: 230 lb (104.3 kg)  Height: 5\' 8"  (1.727 m)  PainSc: 5    Body  mass index is 34.97 kg/m.     07/05/2023    1:46 PM 05/27/2020   11:31 AM  Advanced Directives  Does Patient Have a Medical Advance Directive? No Yes  Type of Special educational needs teacher of [place];Living will  Would patient like information on creating a medical advance directive? No - Patient declined     Current Medications (verified) Outpatient Encounter Medications as of 07/05/2023  Medication Sig   aspirin 81 MG EC tablet Take by mouth.   atorvastatin (LIPITOR) 20 MG tablet Take 20 mg by mouth daily.   cyclobenzaprine (FLEXERIL) 10 MG tablet Take 10 mg by mouth 3 (three) times daily as needed for muscle spasms.   diclofenac (VOLTAREN) 75 MG EC tablet Take 1 tablet (75 mg total) by mouth 2 (two) times daily.   metoprolol succinate (TOPROL-XL) 25 MG 24 hr tablet Take 25 mg by mouth daily.   sildenafil (VIAGRA) 100 MG tablet TAKE ONE TABLET BY MOUTH DAILY AS NEEDED FOR ERECTILE DYSFUNCTION.   No facility-administered encounter medications on file as of 07/05/2023.    Allergies (verified) Patient has no known allergies.   History: Past Medical History:  Diagnosis Date   Hyperlipidemia    Hypertension    Past Surgical History:  Procedure Laterality Date   carpal tunnel release Left    2024 January   History reviewed. No pertinent family history. Social History   Socioeconomic History   Marital status: Married    Spouse name: Not on file   Number of children: Not on file   Years of education: Not on file   Highest education level: Not on file  Occupational History   Not on file  Tobacco Use   Smoking status: Never   Smokeless tobacco: Never  Substance and Sexual Activity   Alcohol use: Not Currently   Drug use: Not Currently   Sexual activity: Not on file  Other Topics Concern   Not on file  Social History Narrative   Not on file   Social Drivers of Health   Financial Resource Strain: Low Risk  (07/05/2023)   Overall Financial Resource Strain  (CARDIA)    Difficulty of Paying Living Expenses: Not hard at all  Food Insecurity: No Food Insecurity (07/05/2023)   Hunger Vital Sign    Worried About Running Out of Food in the Last Year: Never true    Ran Out of Food in the Last Year: Never true  Transportation Needs: No Transportation Needs (07/05/2023)   PRAPARE - Administrator, Civil Service (Medical): No    Lack of Transportation (Non-Medical): No  Physical Activity: Patient Declined (07/05/2023)   Exercise Vital Sign    Days of Exercise per Week: Patient declined    Minutes of Exercise per Session: Patient declined  Stress: No Stress Concern Present (07/05/2023)   [employer] of Occupational Health - Occupational Stress Questionnaire    Feeling of Stress : Only a little  Social Connections: Socially Integrated (07/05/2023)   Social Connection and Isolation Panel [NHANES]    Frequency of Communication with Friends and Family: More than three times a week    Frequency of Social Gatherings with Friends and Family: More than three times a week    Attends Religious Services: More than 4 times per year    Active Member of [employer] or Organizations: Yes    Attends Engineer, structural: More than 4 times per year    Marital Status: Married    Tobacco Counseling Counseling given: Yes    Clinical Intake:  Pre-visit preparation completed: Yes  Pain : 0-10 Pain Score: 5  Pain Type: Chronic pain Pain Location: Shoulder Pain Orientation: Right, Left Pain Descriptors / Indicators: Constant, Sharp Pain Onset: More than a month ago Pain Frequency: Constant     BMI - recorded: 34.97 Nutritional Risks: None Diabetes: No  How often do you need to have someone help you when you read instructions, pamphlets, or other written materials from your doctor or pharmacy?: 1 - Never  Interpreter Needed?: No      Activities of Daily Living     07/05/2023    2:01 PM  In your present state of health, do you  have any difficulty performing the following activities:  Hearing? 0  Vision? 0  Difficulty concentrating or making decisions? 0  Walking or climbing stairs? 0  Dressing or bathing? 0  Doing errands, shopping? 0  Preparing Food and eating ? N  Using the Toilet? N  In the past six months, have you accidently leaked urine? N  Do you have problems with loss of bowel control? N  Managing your Medications? N  Managing your Finances? N  Housekeeping or managing your Housekeeping? N    Patient Care Team: Billie Lade, MD as PCP - General (Internal Medicine)  Indicate any recent Medical Services you may have received from other than [facility] providers in the past year (date may be approximate).     Assessment:   This is a routine wellness examination for Nicholas Brandt.  Hearing/Vision screen Hearing Screening - Comments:: Patient denies any hearing difficulties.   Vision Screening - Comments::  Patient is not up to date on yearly eye exams.  Referral placed    Goals Addressed             This Visit's Progress    Patient Stated       To reduce the pain in my shoulders and hands       Depression Screen     07/05/2023    1:48 PM 02/24/2023    9:09 AM 10/02/2014    2:09 PM  PHQ 2/9 Scores  PHQ - 2 Score 1 0 0  PHQ- 9 Score 5 0     Fall Risk     07/05/2023    1:47 PM 02/24/2023    8:55 AM  Fall Risk   Falls in the past year? 0 0  Number falls in past yr: 0 0  Injury with Fall? 0 0  Risk for fall due to : No Fall Risks   Follow up Falls prevention discussed;Falls evaluation completed Education provided    MEDICARE RISK AT HOME:  Medicare Risk at Home Any stairs in or around the home?: Yes If so, are there any without handrails?: No Home free of loose throw rugs in walkways, pet beds, electrical cords, etc?: Yes Adequate lighting in your home to reduce risk of falls?: Yes Life alert?: No Use of a cane, walker or w/c?: No Grab bars in the bathroom?: No Shower chair  or bench in shower?: No Elevated toilet seat or a handicapped toilet?: Yes  TIMED UP AND GO:  Was the test performed?  No  Cognitive Function: 6CIT completed        07/05/2023    1:47 PM  6CIT Screen  What Year? 0 points  What month? 0 points  What time? 0 points  Count back from 20 0 points  Months in reverse 0 points  Repeat phrase 0 points  Total Score 0 points    Immunizations Immunization History  Administered Date(s) Administered   Fluad Quad(high Dose 65+) 06/15/2022   Influenza,inj,Quad PF,6+ Mos 02/03/2021   Influenza,trivalent, recombinat, inj, PF 02/15/2015   Moderna Covid-19 Fall Seasonal Vaccine 32yrs & older 06/15/2022   Moderna Covid-19 Vaccine Bivalent Booster 17yrs & up 06/11/2021   Moderna Sars-Covid-2 Vaccination 07/09/2019, 08/06/2019, 04/20/2020   PNEUMOCOCCAL CONJUGATE-20 12/21/2021    Screening Tests Health Maintenance  Topic Date Due   INFLUENZA VACCINE  12/08/2022   COVID-19 Vaccine (6 - 2024-25 season) 01/08/2023   DTaP/Tdap/Td (1 - Tdap) 02/24/2024 (Originally 09/25/1975)   Medicare Annual Wellness (AWV)  07/04/2024   Colonoscopy  07/14/2025   Pneumonia Vaccine 41+ Years old  Completed   Hepatitis C Screening  Completed   Zoster Vaccines- Shingrix  Completed   HPV VACCINES  Aged Out    Health Maintenance  Health Maintenance Due  Topic Date Due   INFLUENZA VACCINE  12/08/2022   COVID-19 Vaccine (6 - 2024-25 season) 01/08/2023   Health Maintenance Items Addressed: Referral sent to Optometry/Ophthalmology Patient provided with number to call orthopedics for evaluation of shoulder and hand pain  Additional Screening:  Vision Screening: Recommended annual ophthalmology exams for early detection of glaucoma and other disorders of the eye.  Dental Screening: Recommended annual dental exams for proper oral hygiene  Community Resource Referral / Chronic Care Management: CRR required this visit?  No   CCM required this visit?   No     Plan:     I have personally reviewed and noted the following in the patient's chart:  Medical and social history Use of alcohol, tobacco or illicit drugs  Current medications and supplements including opioid prescriptions. Patient is not currently taking opioid prescriptions. Functional ability and status Nutritional status Physical activity Advanced directives List of other physicians Hospitalizations, surgeries, and ER visits in previous 12 months Vitals Screenings to include cognitive, depression, and falls Referrals and appointments  In addition, I have reviewed and discussed with patient certain preventive protocols, quality metrics, and best practice recommendations. A written personalized care plan for preventive services as well as general preventive health recommendations were provided to patient.     Nicholas Brandt, CMA   07/05/2023   After Visit Summary: (MyChart) Due to this being a telephonic visit, the after visit summary with patients personalized plan was offered to patient via MyChart   Notes: Please refer to Routing Comments.

## 2023-08-25 ENCOUNTER — Encounter: Payer: Self-pay | Admitting: Internal Medicine

## 2023-08-25 ENCOUNTER — Ambulatory Visit (INDEPENDENT_AMBULATORY_CARE_PROVIDER_SITE_OTHER): Payer: PPO | Admitting: Internal Medicine

## 2023-08-25 VITALS — BP 132/78 | HR 75 | Ht 68.0 in | Wt 235.2 lb

## 2023-08-25 DIAGNOSIS — M25512 Pain in left shoulder: Secondary | ICD-10-CM

## 2023-08-25 DIAGNOSIS — Z125 Encounter for screening for malignant neoplasm of prostate: Secondary | ICD-10-CM

## 2023-08-25 DIAGNOSIS — M75122 Complete rotator cuff tear or rupture of left shoulder, not specified as traumatic: Secondary | ICD-10-CM

## 2023-08-25 DIAGNOSIS — I10 Essential (primary) hypertension: Secondary | ICD-10-CM | POA: Diagnosis not present

## 2023-08-25 DIAGNOSIS — Z0001 Encounter for general adult medical examination with abnormal findings: Secondary | ICD-10-CM

## 2023-08-25 DIAGNOSIS — M25511 Pain in right shoulder: Secondary | ICD-10-CM

## 2023-08-25 DIAGNOSIS — E782 Mixed hyperlipidemia: Secondary | ICD-10-CM | POA: Diagnosis not present

## 2023-08-25 DIAGNOSIS — M75102 Unspecified rotator cuff tear or rupture of left shoulder, not specified as traumatic: Secondary | ICD-10-CM | POA: Insufficient documentation

## 2023-08-25 DIAGNOSIS — G8929 Other chronic pain: Secondary | ICD-10-CM

## 2023-08-25 NOTE — Patient Instructions (Signed)
 It was a pleasure to see you today.  Thank you for giving us  the opportunity to be involved in your care.  Below is a brief recap of your visit and next steps.  We will plan to see you again in 6 months.  Summary Annual physical completed today We will check basic labs Orthopedic surgery referral placed Follow up in 6 months

## 2023-08-25 NOTE — Progress Notes (Signed)
 Complete physical exam  Patient: Nicholas Brandt   DOB: 1955/10/22   68 y.o. Male  MRN: [id number]  Subjective:    Chief Complaint  Patient presents with   Annual Exam   Shoulder Pain    Shoulder pain in both shoulder , difficult to raise above head   Hand Pain    Left hand pain    Nicholas Brandt is a 68 y.o. male who presents today for a complete physical exam. He reports consuming a general diet. Gym/ health club routine includes walking 2-3 times weekly for 2 miles. He generally feels well. He reports sleeping poorly. He does have additional problems to discuss today. He reports bilateral shoulder pain with a known history of left rotator cuff tear based on previous MRI. He would like to re-establish care with orthopedic surgery to discuss treatment options.    Most recent fall risk assessment:    08/25/2023    7:59 AM  Fall Risk   Falls in the past year? 0  Number falls in past yr: 0  Injury with Fall? 0  Risk for fall due to : No Fall Risks  Follow up Falls evaluation completed     Most recent depression screenings:    08/25/2023    7:59 AM 07/05/2023    1:48 PM  PHQ 2/9 Scores  PHQ - 2 Score 2 1  PHQ- 9 Score 5 5   Vision:Not within last year  and Dental: No current dental problems and Receives regular dental care  Past Medical History:  Diagnosis Date   Hyperlipidemia    Hypertension    Past Surgical History:  Procedure Laterality Date   carpal tunnel release Left    2024 January   Social History   Tobacco Use   Smoking status: Never   Smokeless tobacco: Never  Substance Use Topics   Alcohol use: Not Currently   Drug use: Not Currently   History reviewed. No pertinent family history. No Known Allergies   Patient Care Team: Tobi Fortes, MD as PCP - General (Internal Medicine) Clinic, Theotis Flake, Johny Nap, MD as Referring Physician (Internal Medicine) Georges Kings, MD as Referring Physician (Cardiology)    Outpatient Medications Prior to Visit  Medication Sig   losartan-hydrochlorothiazide (HYZAAR) 50-12.5 MG tablet Take 1 tablet by mouth daily.   aspirin 81 MG EC tablet Take by mouth.   atorvastatin (LIPITOR) 20 MG tablet Take 20 mg by mouth daily.   cyclobenzaprine (FLEXERIL) 10 MG tablet Take 10 mg by mouth 3 (three) times daily as needed for muscle spasms.   diclofenac  (VOLTAREN ) 75 MG EC tablet Take 1 tablet (75 mg total) by mouth 2 (two) times daily.   metoprolol succinate (TOPROL-XL) 25 MG 24 hr tablet Take 25 mg by mouth daily.   sildenafil (VIAGRA) 100 MG tablet TAKE ONE TABLET BY MOUTH DAILY AS NEEDED FOR ERECTILE DYSFUNCTION.   No facility-administered medications prior to visit.   Review of Systems  Constitutional:  Negative for chills and fever.  HENT:  Negative for sore throat.   Respiratory:  Negative for cough and shortness of breath.   Cardiovascular:  Negative for chest pain, palpitations and leg swelling.  Gastrointestinal:  Negative for abdominal pain, blood in stool, constipation, diarrhea, nausea and vomiting.  Genitourinary:  Negative for dysuria and hematuria.  Musculoskeletal:  Positive for joint pain (BIlateral shoulders (L > R), left wrist). Negative for myalgias.  Skin:  Negative for itching and rash.  Neurological:  Negative for dizziness and headaches.  Psychiatric/Behavioral:  Negative for depression and suicidal ideas.       Objective:     BP 132/78   Pulse 75   Ht 5\' 8"  (1.727 m)   Wt 235 lb 3.2 oz (106.7 kg)   SpO2 97%   BMI 35.76 kg/m  BP Readings from Last 3 Encounters:  08/25/23 132/78  02/11/23 120/82  08/20/22 (!) 160/111   Physical Exam Vitals reviewed.  Constitutional:      General: He is not in acute distress.    Appearance: Normal appearance. He is obese. He is not ill-appearing.  HENT:     Head: Normocephalic and atraumatic.     Right Ear: External ear normal.     Left Ear: External ear normal.     Nose: Nose normal. No  congestion or rhinorrhea.     Mouth/Throat:     Mouth: Mucous membranes are moist.     Pharynx: Oropharynx is clear.  Eyes:     General: No scleral icterus.    Extraocular Movements: Extraocular movements intact.     Conjunctiva/sclera: Conjunctivae normal.     Pupils: Pupils are equal, round, and reactive to light.  Cardiovascular:     Rate and Rhythm: Normal rate and regular rhythm.     Pulses: Normal pulses.     Heart sounds: Normal heart sounds. No murmur heard. Pulmonary:     Effort: Pulmonary effort is normal.     Breath sounds: Normal breath sounds. No wheezing, rhonchi or rales.  Abdominal:     General: Abdomen is flat. Bowel sounds are normal. There is no distension.     Palpations: Abdomen is soft.     Tenderness: There is no abdominal tenderness.  Musculoskeletal:        General: No swelling or deformity. Normal range of motion.     Cervical back: Normal range of motion.     Comments: ROM of the shoulders is intact. He is endorses left shoulder pain with abduction beyond 90 degrees.   Skin:    General: Skin is warm and dry.     Capillary Refill: Capillary refill takes less than 2 seconds.  Neurological:     General: No focal deficit present.     Mental Status: He is alert and oriented to person, place, and time.     Motor: No weakness.  Psychiatric:        Mood and Affect: Mood normal.        Behavior: Behavior normal.        Thought Content: Thought content normal.        Assessment & Plan:    Routine Health Maintenance and Physical Exam  Immunization History  Administered Date(s) Administered   Fluad Quad(high Dose 65+) 06/15/2022   Influenza,inj,Quad PF,6+ Mos 02/03/2021   Influenza,trivalent, recombinat, inj, PF 02/15/2015   Moderna Covid-19 Fall Seasonal Vaccine 32yrs & older 06/15/2022   Moderna Covid-19 Vaccine Bivalent Booster 17yrs & up 06/11/2021   Moderna Sars-Covid-2 Vaccination 07/09/2019, 08/06/2019, 04/20/2020   PNEUMOCOCCAL CONJUGATE-20  12/21/2021    Health Maintenance  Topic Date Due   COVID-19 Vaccine (6 - 2024-25 season) 01/08/2023   DTaP/Tdap/Td (1 - Tdap) 02/24/2024 (Originally 09[birth year]/1975)   INFLUENZA VACCINE  12/08/2023   Medicare Annual Wellness (AWV)  07/04/2024   Colonoscopy  07/14/2025   Pneumonia Vaccine 60+ Years old  Completed   Hepatitis C Screening  Completed   Zoster Vaccines- Shingrix  Completed   HPV  VACCINES  Aged Out   Meningococcal B Vaccine  Aged Out    Discussed health benefits of physical activity, and encouraged him to engage in regular exercise appropriate for his age and condition.  Problem List Items Addressed This Visit       Essential hypertension   Remains adequately controlled on current antihypertensive regimen.  No medication changes are indicated today.      Left rotator cuff tear   His acute concern today is bilateral shoulder pain (L>R) with high-grade rotator cuff tear centered on the supraspinatus tendon noted on MRI of the left shoulder from July 2021.  Surgical repair was deferred as he needed to undergo surgery on his left ankle.  He would like to reestablish care with orthopedic surgery to discuss treatment options as his pain has recently worsened.  Orthopedic surgery referral was placed today.      Hyperlipidemia   He is currently prescribed atorvastatin 20 mg daily.  Repeat lipid panel ordered today.      Encounter for well adult exam with abnormal findings - Primary   Annual physical completed today.  Previous records and labs reviewed. -Repeat labs ordered today -Orthopedic surgery referral placed in setting of known rotator cuff tear of the left shoulder -Preventative care items are largely up-to-date -We will tentatively plan for follow-up in 6 months      Return in about 6 months (around 02/24/2024).  Tobi Fortes, MD

## 2023-08-25 NOTE — Assessment & Plan Note (Signed)
 His acute concern today is bilateral shoulder pain (L>R) with high-grade rotator cuff tear centered on the supraspinatus tendon noted on MRI of the left shoulder from July 2021.  Surgical repair was deferred as he needed to undergo surgery on his left ankle.  He would like to reestablish care with orthopedic surgery to discuss treatment options as his pain has recently worsened.  Orthopedic surgery referral was placed today.

## 2023-08-25 NOTE — Assessment & Plan Note (Signed)
 Annual physical completed today.  Previous records and labs reviewed. -Repeat labs ordered today -Orthopedic surgery referral placed in setting of known rotator cuff tear of the left shoulder -Preventative care items are largely up-to-date -We will tentatively plan for follow-up in 6 months

## 2023-08-25 NOTE — Assessment & Plan Note (Signed)
 Remains adequately controlled on current antihypertensive regimen.  No medication changes are indicated today.

## 2023-08-25 NOTE — Assessment & Plan Note (Signed)
He is currently prescribed atorvastatin 20 mg daily. -Repeat lipid panel ordered today 

## 2023-08-26 LAB — CBC WITH DIFFERENTIAL/PLATELET
Basophils Absolute: 0 10*3/uL (ref 0.0–0.2)
Basos: 1 %
EOS (ABSOLUTE): 0.1 10*3/uL (ref 0.0–0.4)
Eos: 3 %
Hematocrit: 41.9 % (ref 37.5–51.0)
Hemoglobin: 13.6 g/dL (ref 13.0–17.7)
Immature Grans (Abs): 0 10*3/uL (ref 0.0–0.1)
Immature Granulocytes: 0 %
Lymphocytes Absolute: 1.2 10*3/uL (ref 0.7–3.1)
Lymphs: 22 %
MCH: 28 pg (ref 26.6–33.0)
MCHC: 32.5 g/dL (ref 31.5–35.7)
MCV: 86 fL (ref 79–97)
Monocytes Absolute: 0.4 10*3/uL (ref 0.1–0.9)
Monocytes: 8 %
Neutrophils Absolute: 3.8 10*3/uL (ref 1.4–7.0)
Neutrophils: 66 %
Platelets: 160 10*3/uL (ref 150–450)
RBC: 4.86 x10E6/uL (ref 4.14–5.80)
RDW: 13.1 % (ref 11.6–15.4)
WBC: 5.6 10*3/uL (ref 3.4–10.8)

## 2023-08-26 LAB — LIPID PANEL
Chol/HDL Ratio: 2.7 ratio (ref 0.0–5.0)
Cholesterol, Total: 164 mg/dL (ref 100–199)
HDL: 61 mg/dL (ref >39)
LDL Chol Calc (NIH): 89 mg/dL (ref 0–99)
Triglycerides: 70 mg/dL (ref 0–149)
VLDL Cholesterol Cal: 14 mg/dL (ref 5–40)

## 2023-08-26 LAB — CMP14+EGFR
ALT: 29 IU/L (ref 0–44)
AST: 34 IU/L (ref 0–40)
Albumin: 4.2 g/dL (ref 3.9–4.9)
Alkaline Phosphatase: 99 IU/L (ref 44–121)
BUN/Creatinine Ratio: 12 (ref 10–24)
BUN: 13 mg/dL (ref 8–27)
Bilirubin Total: 0.4 mg/dL (ref 0.0–1.2)
CO2: 23 mmol/L (ref 20–29)
Calcium: 9.2 mg/dL (ref 8.6–10.2)
Chloride: 107 mmol/L — ABNORMAL HIGH (ref 96–106)
Creatinine, Ser: 1.09 mg/dL (ref 0.76–1.27)
Globulin, Total: 2.3 g/dL (ref 1.5–4.5)
Glucose: 85 mg/dL (ref 70–99)
Potassium: 4.2 mmol/L (ref 3.5–5.2)
Sodium: 143 mmol/L (ref 134–144)
Total Protein: 6.5 g/dL (ref 6.0–8.5)
eGFR: 75 mL/min/{1.73_m2} (ref >59)

## 2023-08-26 LAB — HEMOGLOBIN A1C
Est. average glucose Bld gHb Est-mCnc: 123 mg/dL
Hgb A1c MFr Bld: 5.9 % — ABNORMAL HIGH (ref 4.8–5.6)

## 2023-08-26 LAB — TSH+FREE T4
Free T4: 0.93 ng/dL (ref 0.82–1.77)
TSH: 1.22 u[IU]/mL (ref 0.450–4.500)

## 2023-08-26 LAB — B12 AND FOLATE PANEL
Folate: 7.5 ng/mL (ref >3.0)
Vitamin B-12: 793 pg/mL (ref 232–1245)

## 2023-08-26 LAB — VITAMIN D 25 HYDROXY (VIT D DEFICIENCY, FRACTURES): Vit D, 25-Hydroxy: 23.9 ng/mL — ABNORMAL LOW (ref 30.0–100.0)

## 2023-08-28 ENCOUNTER — Encounter: Payer: Self-pay | Admitting: Internal Medicine

## 2023-08-30 LAB — PSA: Prostate Specific Ag, Serum: 1 ng/mL (ref 0.0–4.0)

## 2023-08-30 LAB — SPECIMEN STATUS REPORT

## 2023-09-15 DIAGNOSIS — M19012 Primary osteoarthritis, left shoulder: Secondary | ICD-10-CM | POA: Insufficient documentation

## 2023-09-15 DIAGNOSIS — F4312 Post-traumatic stress disorder, chronic: Secondary | ICD-10-CM | POA: Insufficient documentation

## 2023-09-15 DIAGNOSIS — F419 Anxiety disorder, unspecified: Secondary | ICD-10-CM | POA: Insufficient documentation

## 2023-09-15 DIAGNOSIS — R7303 Prediabetes: Secondary | ICD-10-CM | POA: Insufficient documentation

## 2023-09-15 DIAGNOSIS — Z7729 Contact with and (suspected ) exposure to other hazardous substances: Secondary | ICD-10-CM | POA: Insufficient documentation

## 2023-09-18 ENCOUNTER — Ambulatory Visit: Admitting: Orthopedic Surgery

## 2023-09-28 ENCOUNTER — Encounter: Payer: Self-pay | Admitting: Orthopedic Surgery

## 2023-09-28 ENCOUNTER — Ambulatory Visit: Admitting: Orthopedic Surgery

## 2023-09-28 VITALS — BP 120/87 | HR 63 | Ht 68.0 in | Wt 227.0 lb

## 2023-09-28 DIAGNOSIS — M25512 Pain in left shoulder: Secondary | ICD-10-CM | POA: Diagnosis not present

## 2023-09-28 DIAGNOSIS — G8929 Other chronic pain: Secondary | ICD-10-CM

## 2023-09-28 NOTE — Patient Instructions (Signed)
 While we are working on your approval for MRI please go ahead and call to schedule your appointment with [facility] within at least one (1) week.   Central Scheduling [phone]

## 2023-09-28 NOTE — Progress Notes (Signed)
 Patient ID: Nicholas Brandt, male   DOB: [birth year], 69 y.o.   MRN: [id number]  SUMMARY AND PLAN:  67 year old male chronic shoulder pain secondary to rotator cuff tear.  He had physical therapy, injection.  He has had pain for 4 to 5 years.  MRI of the planned surgery with plan for cuff repair versus reverse replacement    Chief Complaint  Patient presents with   Shoulder Pain    Left worse than right for several years       HPI Nicholas Brandt is a 68 y.o. male.  This is a 68 year old male who presented to us  with left shoulder pain  He has had for 4 years.  He had an MRI of his left shoulder back in 2021 with [facility] and he had a large rotator cuff tear of 3.3 cm retraction and 2.7 cm front to back extending into the infraspinatus with mild arthritis at that time  He had prior treatment which included injection and physical therapy  Unfortunately he did not get good relief.  He now has pain weakness decreased range of motion and cannot sleep on his left side   Current Outpatient Medications  Medication Instructions   aspirin 81 MG EC tablet Take by mouth.   atorvastatin (LIPITOR) 20 mg, Daily   losartan-hydrochlorothiazide (HYZAAR) 50-12.5 MG tablet 1 tablet, Daily   metoprolol succinate (TOPROL-XL) 25 mg, Daily   sildenafil (VIAGRA) 100 MG tablet TAKE ONE TABLET BY MOUTH DAILY AS NEEDED FOR ERECTILE DYSFUNCTION.    No Known Allergies  Review of Systems Review of Systems  Constitutional:  Negative for chills, fever and weight loss.  Respiratory:  Negative for shortness of breath.   Cardiovascular:  Negative for chest pain.  Neurological:  Negative for tingling.    Past Medical History:  Diagnosis Date   Hyperlipidemia    Hypertension     Past Surgical History:  Procedure Laterality Date   carpal tunnel release Left    2024 January    History reviewed. No pertinent family history.   Social History   Tobacco Use   Smoking status: Never    Smokeless tobacco: Never  Substance Use Topics   Alcohol use: Not Currently   Drug use: Not Currently    No Known Allergies    Current Meds  Medication Sig   aspirin 81 MG EC tablet Take by mouth.   atorvastatin (LIPITOR) 20 MG tablet Take 20 mg by mouth daily.   losartan-hydrochlorothiazide (HYZAAR) 50-12.5 MG tablet Take 1 tablet by mouth daily.   metoprolol succinate (TOPROL-XL) 25 MG 24 hr tablet Take 25 mg by mouth daily.   sildenafil (VIAGRA) 100 MG tablet TAKE ONE TABLET BY MOUTH DAILY AS NEEDED FOR ERECTILE DYSFUNCTION.       Physical Exam BP 120/87   Pulse 63   Ht 5\' 8"  (1.727 m)   Wt 227 lb (103 kg)   BMI 34.52 kg/m   Ambulatory status normal with no assistive devices  GENERAL : APPEARANCE IS NORMAL GROOMING IS GOOD  NORMAL MOOD AND AFFECT  AWAKE ALERT AND ORIENTED X 3   Left SHOULDER  TENDERNESS anterolateral acromion lateral deltoid ROM active flexion 110 passive 120, external rotation 40 degrees, internal rotation L5, abduction 85   STABLE ANTERIORLY POSTERIORLY AND INFERIORLY SKIN CLEAN     PROVOCATIVE TESTS  DROP ARM TEST negative PAINFUL ARC 80-1 20 EMPTY CAN -JOBST TEST positive weakness grade 3+ to 4    MEDICAL  DECISION MAKING  A.  Encounter Diagnosis  Name Primary?   Chronic left shoulder pain Yes    B. DATA ANALYSED:    IMAGING: Independent interpretation of images: MRI report will be read into the chart it was done at [facility] on November 08, 2019 the impression was high-grade rotator cuff tear centered in the supraspinatus tendon severe intra-articular long head biceps tendinosis minimal glenohumeral degenerative changes hypertrophic a acromioclavicular joint arthrosis  Rotator cuff 2.7 cm right high-grade supraspinatus tear extending posteriorly into the undersurface of the infraspinatus with wide undersurface delamination and retraction gap of 3.3 cm.  Intact rotator cuff muscle belly volume.  Minimal glenohumeral  arthrosis hypertrophic acromioclavicular joint arthrosis  Orders: MRI left shoulder  Outside records reviewed: None of alible  C. MANAGEMENT to be determined  No orders of the defined types were placed in this encounter.

## 2023-09-28 NOTE — Progress Notes (Signed)
  Intake history:  BP 120/87   Pulse 63   Ht 5\' 8"  (1.727 m)   Wt 227 lb (103 kg)   BMI 34.52 kg/m  Body mass index is 34.52 kg/m.    WHAT ARE WE SEEING YOU FOR TODAY?   bilateral shoulder left one is more severe  How long has this bothered you? (DOI?DOS?WS?)  Several years   Anticoag.  No  Diabetes No  Heart disease No  Hypertension Yes  SMOKING HX No  Kidney disease No  Any ALLERGIES ______________NKDA________________________________   Treatment:  Have you taken:  Tylenol  Yes but not recently   Advil No   Had PT yes  Had injection yes  Other  _________________________    [facility] Outside Information   MRI Shoulder Left  WO IV Contrast  Anatomical Region Laterality Modality  Shoulder -- Magnetic Resonance  Chest -- --  Arm -- --   Impression  IMPRESSION: 1.  High-grade rotator cuff tear centered in the supraspinatus tendon as above. 2.  Severe intra-articular long head biceps tendinosis. 3.  Minimal glenohumeral degenerative changes. 4.  Hypertrophic acromioclavicular joint arthrosis.  Electronically Signed by: Kandis Ormond Narrative  COMPARISON:  None.   INDICATION: rotator cuff tear   TECHNIQUE: Multiplanar, multisequence MRI of the shoulder (MRI SHOULDER LEFT WO IV CONTRAST) was performed.  FINDINGS:    ROTATOR CUFF/BICEPS TENDON: - 2.7 cm wide high-grade supraspinatus tendon tear extending posteriorly into the undersurface of the infraspinatus, with undersurface delamination and retraction gap of 3.3 cm. - Intact rotator cuff muscle belly volume. - Trace subacromial/subdeltoid effusion. - Intra-articular long head biceps tendinosis.  OSSEOUS STRUCTURES/JOINTS: - Hypertrophic acromioclavicular joint arthrosis. - Minimal glenohumeral arthrosis.   - No acute fractures or dislocations. - No evidence of avascular necrosis. - Chronic/degenerative changes of the labrum.  SOFT TISSUES: - No abnormal masses or fluid  collections. Procedure Note  Manny Sees, DO - 11/11/2019 Formatting of this note might be different from the original. COMPARISON:  None.   INDICATION: rotator cuff tear   TECHNIQUE: Multiplanar, multisequence MRI of the shoulder (MRI SHOULDER LEFT WO IV CONTRAST) was performed.  FINDINGS:    ROTATOR CUFF/BICEPS TENDON: - 2.7 cm wide high-grade supraspinatus tendon tear extending posteriorly into the undersurface of the infraspinatus, with undersurface delamination and retraction gap of 3.3 cm. - Intact rotator cuff muscle belly volume. - Trace subacromial/subdeltoid effusion. - Intra-articular long head biceps tendinosis.  OSSEOUS STRUCTURES/JOINTS: - Hypertrophic acromioclavicular joint arthrosis. - Minimal glenohumeral arthrosis.   - No acute fractures or dislocations. - No evidence of avascular necrosis. - Chronic/degenerative changes of the labrum.  SOFT TISSUES: - No abnormal masses or fluid collections.   IMPRESSION: 1.  High-grade rotator cuff tear centered in the supraspinatus tendon as above. 2.  Severe intra-articular long head biceps tendinosis. 3.  Minimal glenohumeral degenerative changes. 4.  Hypertrophic acromioclavicular joint arthrosis.  Electronically Signed by: Kandis Ormond Exam End: 11/08/19 11:08   Specimen Collected: 11/11/19 15:47 Last Resulted: 11/11/19 15:52  Received From: [facility]  Result Received: 06/23/23 10:22

## 2023-10-06 ENCOUNTER — Ambulatory Visit (HOSPITAL_COMMUNITY)
Admission: RE | Admit: 2023-10-06 | Discharge: 2023-10-06 | Disposition: A | Discharge status: Home / Self Care | Source: Ambulatory Visit | Attending: Orthopedic Surgery | Admitting: Orthopedic Surgery

## 2023-10-06 DIAGNOSIS — G8929 Other chronic pain: Secondary | ICD-10-CM | POA: Insufficient documentation

## 2023-10-06 DIAGNOSIS — M25512 Pain in left shoulder: Secondary | ICD-10-CM | POA: Insufficient documentation

## 2023-10-27 ENCOUNTER — Ambulatory Visit: Admitting: Orthopedic Surgery

## 2023-10-27 ENCOUNTER — Encounter: Payer: Self-pay | Admitting: Orthopedic Surgery

## 2023-10-27 VITALS — BP 143/80 | HR 74 | Ht 68.0 in | Wt 224.0 lb

## 2023-10-27 DIAGNOSIS — M75122 Complete rotator cuff tear or rupture of left shoulder, not specified as traumatic: Secondary | ICD-10-CM | POA: Diagnosis not present

## 2023-10-27 NOTE — Progress Notes (Signed)
 New Patient Visit  Assessment: Nicholas Brandt is a 68 y.o. male with the following: There are no diagnoses linked to this encounter.  Plan: [name] has a large, chronic rotator cuff tear of the left shoulder.  We had an extensive discussion in clinic today.  He has continued pain and lack of function overall.  I do not think that the tear is repairable, due to the chronicity of the injury.  He does have some fatty atrophy of the supraspinatus.  Given the extent of the tear, as well as the chronicity, I would recommend a reverse shoulder arthroplasty, if he is interested in surgery.  The procedure was discussed in detail.  All questions have been answered.  If he wishes to proceed we will need to obtain medical clearance, as well as a CT scan prior to planning for surgery.  He states his understanding.  He states he is looking towards the end of the year.  Follow-up: Return for After medical clearance for OR.  Subjective:  Chief Complaint  Patient presents with   Results    L Shoulder MRI     History of Present Illness: Nicholas Brandt is a 68 y.o. male who presents for evaluation of left shoulder pain.  He has had pain and decreased function of left shoulder for years.  He has previously been evaluated by Dr. Phyllis Breeze.  He has had an injection recently.  MRI from 2021 demonstrated a large rotator cuff tear.  Repeat MRI demonstrates similar findings, with fatty atrophy of the supraspinatus.  He has pain in the anterior aspect of the shoulder.  Is difficult with certain activities.  Therapy has not been helpful.   Review of Systems: No fevers or chills No numbness or tingling No chest pain No shortness of breath No bowel or bladder dysfunction No GI distress No headaches   Medical History:  Past Medical History:  Diagnosis Date   Hyperlipidemia    Hypertension     Past Surgical History:  Procedure Laterality Date   carpal tunnel release Left     2024 January    No family history on file. Social History   Tobacco Use   Smoking status: Never   Smokeless tobacco: Never  Substance Use Topics   Alcohol use: Not Currently   Drug use: Not Currently    No Known Allergies  Current Meds  Medication Sig   aspirin 81 MG EC tablet Take by mouth.   atorvastatin (LIPITOR) 20 MG tablet Take 20 mg by mouth daily.   losartan-hydrochlorothiazide (HYZAAR) 50-12.5 MG tablet Take 1 tablet by mouth daily.   metoprolol succinate (TOPROL-XL) 25 MG 24 hr tablet Take 25 mg by mouth daily.   sildenafil (VIAGRA) 100 MG tablet TAKE ONE TABLET BY MOUTH DAILY AS NEEDED FOR ERECTILE DYSFUNCTION.    Objective: BP (!) 143/80   Pulse 74   Ht 5' 8 (1.727 m)   Wt 224 lb (101.6 kg)   BMI 34.06 kg/m   Physical Exam:  General: Alert and oriented. and No acute distress. Gait: Normal gait.  Evaluation of left shoulder demonstrates no deformity.  No redness.  He has near full range of motion, with some discomfort with abduction, as well as testing of the supraspinatus.  Positive Jobe's.  Negative belly press.  Fingers are warm and well-perfused.  Sensation intact throughout the left hand.  Sensation intact in the axillary nerve distribution.  IMAGING: I personally reviewed images previously obtained in  clinic  MRI left shoulder  IMPRESSION: Full-thickness retracted supraspinatus tendon tear with delamination of the undersurface fibers. Undersurface partial tear of the infraspinatus tendon with interstitial extension. Likely insertional tendinosis and a partial tear of the subscapularis tendon. Mild fatty atrophy is seen in the supraspinatus and infraspinatus muscles as above.   Findings suspicious for a full-thickness retracted biceps tendon tear. Correlation for biceps tendon symptoms. The intra-articular segment is not visualized.   Blunting and abnormal signal in the superior labrum which is poorly defined. Correlation for superior labral  symptoms.   Mild AC joint arthrosis and mild glenohumeral arthrosis. New Medications:  No orders of the defined types were placed in this encounter.     Tonita Frater, MD  10/27/2023 9:12 AM

## 2024-02-08 ENCOUNTER — Encounter: Payer: Self-pay | Admitting: Family Medicine

## 2024-02-08 ENCOUNTER — Ambulatory Visit (INDEPENDENT_AMBULATORY_CARE_PROVIDER_SITE_OTHER): Admitting: Family Medicine

## 2024-02-08 VITALS — BP 150/101 | HR 74 | Resp 16 | Ht 68.0 in | Wt 230.0 lb

## 2024-02-08 DIAGNOSIS — Z23 Encounter for immunization: Secondary | ICD-10-CM | POA: Diagnosis not present

## 2024-02-08 DIAGNOSIS — F439 Reaction to severe stress, unspecified: Secondary | ICD-10-CM

## 2024-02-08 DIAGNOSIS — E7849 Other hyperlipidemia: Secondary | ICD-10-CM | POA: Diagnosis not present

## 2024-02-08 DIAGNOSIS — I10 Essential (primary) hypertension: Secondary | ICD-10-CM

## 2024-02-08 NOTE — Patient Instructions (Signed)
 I appreciate the opportunity to provide care to you today!    Follow up:  5 months  For a Healthier YOU, I Recommend: Reducing your intake of sugar, sodium, carbohydrates, and saturated fats. Increasing your fiber intake by incorporating more whole grains, fruits, and vegetables into your meals. Setting healthy goals with a focus on lowering your consumption of carbs, sugar, and unhealthy fats. Adding variety to your diet by including a wide range of fruits and vegetables. Cutting back on soda and limiting processed foods as much as possible. Staying active: In addition to taking your weight loss medication, aim for at least 150 minutes of moderate-intensity physical activity each week for optimal results.    Referrals today- Behavioral health for talk therapy     Please continue to a heart-healthy diet and increase your physical activities. Try to exercise for at least five days a week.    It was a pleasure to see you and I look forward to continuing to work together on your health and well-being. Please do not hesitate to call the office if you need care or have questions about your care.  In case of emergency, please visit the Emergency Department for urgent care, or contact our clinic at [phone] to schedule an appointment. We're here to help you!   Have a wonderful day and week. With Gratitude, Joby Richart MSN, FNP-BC

## 2024-02-08 NOTE — Progress Notes (Signed)
 Established Patient Office Visit  Subjective:  Patient ID: Nicholas Brandt, male    DOB: 03/20/1956  Age: 68 y.o. MRN: [id number]  CC:  Chief Complaint  Patient presents with   Mental Health Problem    Pt here wanting referral for local behavioral health     HPI Nicholas Brandt is a 69 y.o. male with past medical history of Essential hypertension, hyperlipidemia, left rotator cuff tear presents for f/u of  chronic medical conditions.    Past Medical History:  Diagnosis Date   Arthritis 2010   Hyperlipidemia    Hypertension 2017    Past Surgical History:  Procedure Laterality Date   carpal tunnel release Left    2024 January    Family History  Problem Relation Age of Onset   Heart disease Mother    Alcohol abuse Brother    COPD Brother    Depression Sister    Early death Sister    Drug abuse Brother     Social History   Socioeconomic History   Marital status: Married    Spouse name: Not on file   Number of children: Not on file   Years of education: Not on file   Highest education level: Associate degree: occupational, Scientist, product/process development, or vocational program  Occupational History   Not on file  Tobacco Use   Smoking status: Never   Smokeless tobacco: Never  Substance and Sexual Activity   Alcohol use: Not Currently   Drug use: Not Currently   Sexual activity: Not on file  Other Topics Concern   Not on file  Social History Narrative   Not on file   Social Drivers of Health   Financial Resource Strain: Low Risk  (08/19/2023)   Overall Financial Resource Strain (CARDIA)    Difficulty of Paying Living Expenses: Not hard at all  Food Insecurity: No Food Insecurity (08/19/2023)   Hunger Vital Sign    Worried About Running Out of Food in the Last Year: Never true    Ran Out of Food in the Last Year: Never true  Transportation Needs: No Transportation Needs (08/19/2023)   PRAPARE - Administrator, Civil Service (Medical): No    Lack of  Transportation (Non-Medical): No  Physical Activity: Sufficiently Active (08/19/2023)   Exercise Vital Sign    Days of Exercise per Week: 3 days    Minutes of Exercise per Session: 60 min  Stress: Stress Concern Present (08/19/2023)   [employer] of Occupational Health - Occupational Stress Questionnaire    Feeling of Stress : To some extent  Social Connections: Socially Integrated (08/19/2023)   Social Connection and Isolation Panel    Frequency of Communication with Friends and Family: Twice a week    Frequency of Social Gatherings with Friends and Family: Twice a week    Attends Religious Services: More than 4 times per year    Active Member of [employer] or Organizations: Yes    Attends Engineer, structural: More than 4 times per year    Marital Status: Married  Catering manager Violence: Not At Risk (07/05/2023)   Humiliation, Afraid, Rape, and Kick questionnaire    Fear of Current or Ex-Partner: No    Emotionally Abused: No    Physically Abused: No    Sexually Abused: No    Outpatient Medications Prior to Visit  Medication Sig Dispense Refill   aspirin 81 MG EC tablet Take by mouth.     atorvastatin (LIPITOR)  20 MG tablet Take 20 mg by mouth daily.     losartan-hydrochlorothiazide (HYZAAR) 50-12.5 MG tablet Take 1 tablet by mouth daily.     metoprolol succinate (TOPROL-XL) 25 MG 24 hr tablet Take 25 mg by mouth daily.     sildenafil (VIAGRA) 100 MG tablet TAKE ONE TABLET BY MOUTH DAILY AS NEEDED FOR ERECTILE DYSFUNCTION.     No facility-administered medications prior to visit.    No Known Allergies  ROS Review of Systems  Constitutional:  Negative for fatigue and fever.  Eyes:  Negative for visual disturbance.  Respiratory:  Negative for chest tightness and shortness of breath.   Cardiovascular:  Negative for chest pain and palpitations.  Neurological:  Negative for dizziness and headaches.      Objective:    Physical Exam HENT:     Head: Normocephalic.      Right Ear: External ear normal.     Left Ear: External ear normal.     Nose: No congestion or rhinorrhea.     Mouth/Throat:     Mouth: Mucous membranes are moist.  Cardiovascular:     Rate and Rhythm: Regular rhythm.     Heart sounds: No murmur heard. Pulmonary:     Effort: No respiratory distress.     Breath sounds: Normal breath sounds.  Neurological:     Mental Status: He is alert.     BP (!) 150/101   Pulse 74   Resp 16   Ht 5' 8 (1.727 m)   Wt 230 lb (104.3 kg)   SpO2 97%   BMI 34.97 kg/m  Wt Readings from Last 3 Encounters:  02/08/24 230 lb (104.3 kg)  10/27/23 224 lb (101.6 kg)  09/28/23 227 lb (103 kg)    Lab Results  Component Value Date   TSH 1.220 08/25/2023   Lab Results  Component Value Date   WBC 5.6 08/25/2023   HGB 13.6 08/25/2023   HCT 41.9 08/25/2023   MCV 86 08/25/2023   PLT 160 08/25/2023   Lab Results  Component Value Date   NA 143 08/25/2023   K 4.2 08/25/2023   CO2 23 08/25/2023   GLUCOSE 85 08/25/2023   BUN 13 08/25/2023   CREATININE 1.09 08/25/2023   BILITOT 0.4 08/25/2023   ALKPHOS 99 08/25/2023   AST 34 08/25/2023   ALT 29 08/25/2023   PROT 6.5 08/25/2023   ALBUMIN 4.2 08/25/2023   CALCIUM 9.2 08/25/2023   ANIONGAP 7 05/27/2020   EGFR 75 08/25/2023   Lab Results  Component Value Date   CHOL 164 08/25/2023   Lab Results  Component Value Date   HDL 61 08/25/2023   Lab Results  Component Value Date   LDLCALC 89 08/25/2023   Lab Results  Component Value Date   TRIG 70 08/25/2023   Lab Results  Component Value Date   CHOLHDL 2.7 08/25/2023   Lab Results  Component Value Date   HGBA1C 5.9 (H) 08/25/2023      Assessment & Plan:  Other hyperlipidemia Assessment & Plan: Encouraged to continue taking Lipitor 20 mg daily. Encouraged lifestyle modifications, including decreasing his intake of greasy, fatty, and starchy foods.    Essential hypertension Assessment & Plan: Uncontrolled BP. Reports that  he did not take his blood pressure medication today. He is asymptomatic in the clinic. Encouraged to continue treatment regimen on Hyzaar 50-12.5 mg daily. Will follow up with the nurse in 2 weeks to reassess blood pressure. Low sodium diet and increased physical  activity encouraged.    Stress Assessment & Plan: He reports wanting to speak with a therapist regarding life issues that he does not want to discuss with me. Denies SI/HI and AVH. Referral placed  Patient and/or legal guardian verbally consented to [facility] services about presenting concerns and psychiatric consultation as appropriate.  The services will be billed as appropriate for the patient   Orders: -     Amb ref to Integrated Behavioral Health  Encounter for immunization Assessment & Plan: Patient educated on CDC recommendation for the vaccine. Verbal consent was obtained from the patient, vaccine administered by nurse, no sign of adverse reactions noted at this time. Patient education on arm soreness and use of tylenol  or ibuprofen for this patient  was discussed. Patient educated on the signs and symptoms of adverse effect and advise to contact the office if they occur.   Orders: -     Flu vaccine HIGH DOSE PF(Fluzone Trivalent)  Note: This chart has been completed using Engineer, civil (consulting) software, and while attempts have been made to ensure accuracy, certain words and phrases may not be transcribed as intended.    Follow-up: Return in about 5 months (around 07/08/2024).   Abdulrahman Bracey  Z Bacchus, FNP

## 2024-02-10 DIAGNOSIS — F439 Reaction to severe stress, unspecified: Secondary | ICD-10-CM | POA: Insufficient documentation

## 2024-02-10 DIAGNOSIS — Z23 Encounter for immunization: Secondary | ICD-10-CM | POA: Insufficient documentation

## 2024-02-10 NOTE — Assessment & Plan Note (Signed)
 Uncontrolled BP. Reports that he did not take his blood pressure medication today. He is asymptomatic in the clinic. Encouraged to continue treatment regimen on Hyzaar 50-12.5 mg daily. Will follow up with the nurse in 2 weeks to reassess blood pressure. Low sodium diet and increased physical activity encouraged.

## 2024-02-10 NOTE — Assessment & Plan Note (Signed)
 Encouraged to continue taking Lipitor 20 mg daily. Encouraged lifestyle modifications, including decreasing his intake of greasy, fatty, and starchy foods.

## 2024-02-10 NOTE — Assessment & Plan Note (Signed)
 Patient educated on CDC recommendation for the vaccine. Verbal consent was obtained from the patient, vaccine administered by nurse, no sign of adverse reactions noted at this time. Patient education on arm soreness and use of tylenol or ibuprofen for this patient  was discussed. Patient educated on the signs and symptoms of adverse effect and advise to contact the office if they occur.

## 2024-02-10 NOTE — Assessment & Plan Note (Signed)
 He reports wanting to speak with a therapist regarding life issues that he does not want to discuss with me. Denies SI/HI and AVH. Referral placed  Patient and/or legal guardian verbally consented to Nicholas Brandt services about presenting concerns and psychiatric consultation as appropriate.  The services will be billed as appropriate for the patient

## 2024-02-23 ENCOUNTER — Ambulatory Visit

## 2024-03-04 ENCOUNTER — Telehealth: Payer: Self-pay | Admitting: Family Medicine

## 2024-03-04 NOTE — Telephone Encounter (Signed)
 Referral was entered incorrectly, it needs to be sent to [facility] upstairs not OBGYN.

## 2024-03-05 ENCOUNTER — Ambulatory Visit: Admitting: Orthopedic Surgery

## 2024-03-05 ENCOUNTER — Other Ambulatory Visit (INDEPENDENT_AMBULATORY_CARE_PROVIDER_SITE_OTHER): Payer: Self-pay

## 2024-03-05 ENCOUNTER — Encounter: Payer: Self-pay | Admitting: Orthopedic Surgery

## 2024-03-05 VITALS — BP 167/108 | HR 67 | Ht 68.0 in | Wt 230.0 lb

## 2024-03-05 DIAGNOSIS — M25512 Pain in left shoulder: Secondary | ICD-10-CM | POA: Diagnosis not present

## 2024-03-05 DIAGNOSIS — M75122 Complete rotator cuff tear or rupture of left shoulder, not specified as traumatic: Secondary | ICD-10-CM | POA: Diagnosis not present

## 2024-03-05 DIAGNOSIS — G8929 Other chronic pain: Secondary | ICD-10-CM

## 2024-03-05 NOTE — Patient Instructions (Addendum)
 [facility] - [place], [place]  [phone]    Preoperative Instructions  Your surgery will be at [facility], scheduled with Dr Oneil Horde   The hospital will contact you with a preoperative appointment to discuss Anesthesia. The phone number is [phone]   Please bring your medications with you for the appointment.  They will tell you the arrival time and medication instructions when you have your preoperative evaluation.  Do not wear nail polish the day of your surgery and if you take Phentermine you need to stop this medication ONE WEEK prior to your surgery.    If you take an blood thinning medication, we will need to stop this prior to surgery.  Typically, we stop this medicine at least 5 days prior to surgery.  We will need to confirm this with the doctor who prescribes this medication.  If you are taking medications or an injection for diabetes, or for weight management, this medicine will need to be stopped at least 7 days prior to surgery.     Surgery will be scheduled pending authorization by your insurance company.  Date to be determined   Pain medicine policy:  Per [facility] clinic policy, our goal is ensure optimal postoperative pain control with a multimodal pain management strategy.   For all [facility] patients, our goal is to wean post-operative narcotic medications by 6 weeks post-operatively.   If this is not possible due to utilization of pain medication prior to surgery, your [facility] doctor will support your acute post-operative pain control for the first 6 weeks postoperatively, with a plan to transition you back to your primary pain team following that.   [facility]e will work to ensure a therapist, occupational.

## 2024-03-06 ENCOUNTER — Other Ambulatory Visit: Payer: Self-pay

## 2024-03-06 DIAGNOSIS — F419 Anxiety disorder, unspecified: Secondary | ICD-10-CM

## 2024-03-06 NOTE — Telephone Encounter (Signed)
 Referral updated

## 2024-03-06 NOTE — Telephone Encounter (Signed)
 At this time the Referral is for Integrated [facility] - is Me[facility] wanting him to see Dr. Reida OR the Outpatient [facility] Office?  If it is for the Outpatient Office the referral would need to be changed to Psychology or Psychiatry (whichever the Patient is needing).  Thanks for your help :)

## 2024-03-06 NOTE — Progress Notes (Signed)
 Return patient Visit  Assessment: Nicholas Brandt is a 68 y.o. 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  We have previously discussed the findings on MRI.  Based on the size of the tear, as well as the retraction and fatty atrophy noted within the superior spinatus and the infraspinatus, I do not think that he would benefit from a repair with or without patch augmentation.  This was discussed in clinic today.  The patient is in agreement.  Family is also in agreement with avoiding the patch repair.  As a result, he is interested in proceeding with a left reverse shoulder arthroplasty.  The procedure has been discussed in detail.  Pamphlets were previously provided.  Risks and benefits were discussed, and the patient and his family had adequate time to ask questions.  He is interested in working towards scheduling.  We will work towards obtaining medical clearance.  He may require cardiology clearance as well.  We will place a referral for a CT scan, for preoperative planning.  We will be able to schedule surgery once the steps have been completed.  Follow-up: Return for After medical clearance for OR.  Subjective:  Chief Complaint  Patient presents with   Shoulder Pain    Left     History of Present Illness: Nicholas Brandt is a 68 y.o. male who returns for evaluation of left shoulder pain.  He has had pain and limited function for years, secondary to a known rotator cuff tear.  MRI from 2021 demonstrated a tear, with progression of fatty atrophy on a more recent MRI.  I have previously discussed reverse shoulder arthroplasty, due to the irreparable nature of his tear.  He presents to clinic today for further discussion, and brings family to participate in this discussion.   Review of Systems: No fevers or chills No numbness or tingling No chest pain No  shortness of breath No bowel or bladder dysfunction No GI distress No headaches   Objective: BP (!) 167/108   Pulse 67   Ht 5' 8 (1.727 m)   Wt 230 lb (104.3 kg)   BMI 34.97 kg/m   Physical Exam:  General: Alert and oriented. and No acute distress. Gait: Normal gait.  Left shoulder without deformity.  No swelling.  No redness.  He has pain at extremes of motion.  Positive Jobes.  4/5 strength in the supraspinatus and the infraspinatus.  Negative belly press.  Sensation is intact in the axillary nerve distribution.  Sensation is intact throughout the left hand.  IMAGING: I personally reviewed images previously obtained in clinic  MRI left shoulder  IMPRESSION: Full-thickness retracted supraspinatus tendon tear with delamination of the undersurface fibers. Undersurface partial tear of the infraspinatus tendon with interstitial extension. Likely insertional tendinosis and a partial tear of the subscapularis tendon. Mild fatty atrophy is seen in the supraspinatus and infraspinatus muscles as above.   Findings suspicious for a full-thickness retracted biceps tendon tear. Correlation for biceps tendon symptoms. The intra-articular segment is not visualized.   Blunting and abnormal signal in the superior labrum which is poorly defined. Correlation for superior labral symptoms.   Mild AC joint arthrosis and mild glenohumeral arthrosis. New Medications:  No orders of the defined types were placed in this encounter.     Oneil DELENA Horde, MD  03/06/2024 7:47 PM

## 2024-03-29 ENCOUNTER — Ambulatory Visit: Admitting: Professional Counselor

## 2024-03-29 ENCOUNTER — Telehealth: Payer: Self-pay

## 2024-03-29 DIAGNOSIS — F419 Anxiety disorder, unspecified: Secondary | ICD-10-CM

## 2024-03-29 NOTE — Telephone Encounter (Signed)
 He is here in the lobby wanting to see if a surgery date has been scheduled.  He was wondering if you got all the necessary forms or papers you were needing?
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
## 2024-03-29 NOTE — Telephone Encounter (Signed)
 Please let pt know we haven't received anything, and letters have been sent again.

## 2024-04-01 ENCOUNTER — Encounter (HOSPITAL_COMMUNITY): Payer: Self-pay

## 2024-04-01 ENCOUNTER — Ambulatory Visit (HOSPITAL_COMMUNITY): Admitting: Clinical

## 2024-04-01 DIAGNOSIS — F4321 Adjustment disorder with depressed mood: Secondary | ICD-10-CM | POA: Diagnosis not present

## 2024-04-01 DIAGNOSIS — F411 Generalized anxiety disorder: Secondary | ICD-10-CM
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
## 2024-04-09 NOTE — Patient Instructions (Signed)
 If your symptoms worsen or you have thoughts of suicide/homicide, PLEASE SEEK IMMEDIATE MEDICAL ATTENTION.  You may always call:   National Suicide Hotline: 988 or 539 667 3577 [place] Crisis Line: 458 569 7915 Crisis Recovery in [place]: [phone]     These are available 24 hours a day, 7 days a week.

## 2024-04-12 ENCOUNTER — Ambulatory Visit: Admitting: Professional Counselor

## 2024-04-15 ENCOUNTER — Telehealth: Payer: Self-pay | Admitting: Professional Counselor

## 2024-04-15 DIAGNOSIS — F419 Anxiety disorder, unspecified: Secondary | ICD-10-CM

## 2024-04-15 NOTE — BH Specialist Note (Cosign Needed Addendum)
 Virtual Behavioral Health Treatment Plan Team Note  MRN: [id number] NAME: Nicholas Brandt  DATE: 04/16/24  Start time: Start Time: 0340 End time: Stop Time: 0345 Total time: Total Time in Minutes (Visit): 5  Total number of Virtual BH Treatment Team Plan encounters: 1/4  Treatment Team Attendees: Nicholas Brandt and Nicholas Brandt  Attestation signed by Nicholas Brandt, PMHNP, DNP 04/15/2024 3:43 PM   Collaborative Care Psychiatric Consultant Case Review   Assessment/Provisional Diagnosis 68 year old male with history of multiple medical medications. The patient is referred for anxiety and depression/grief.   Provisional Diagnosis: # MDD, recurrent, mild r/t care   Recommendation 1. Recommend vitamin D  supplement 2. BH specialist to follow up.   Thank you for your consult. Please contact our collaborative care team for any questions or concerns.   I spent 20 minutes chart reviewing, discussing with Nicholas Brandt and documenting in the chart.   Diagnoses:    ICD-10-CM   1. Anxiety disorder, unspecified type  F41.9       Goals, Interventions and Follow-up Plan Goals: Increase healthy adjustment to current life circumstances Interventions: Behavioral Activation Medication Management Recommendations: Defer Follow-up Plan: Refer to [facility] Substance Abuse Specific  Presenting Problem/Current Symptoms:  The patient is a 68 year old male presenting for a collaborative care assessment. His chief complaint is ongoing and profound grief following the death of his grandson in a car accident. He described his grandson as being "like a son," as he and his wife were heavily involved in raising him. This loss has been extremely difficult for both of them, and he reports significant challenges adjusting emotionally.   Screening scores indicate PHQ-9 = 7 and GAD-7 = 6, reflecting mild depressive and anxiety symptoms. The patient reports no prior psychiatric history, has never  taken psychiatric medication, and denies any history of substance use. He does note some past trauma but describes a long, generally successful life with minimal mental health concerns prior to this recent loss.   He has been participating in grief group support, which he finds somewhat helpful, and was referred to collaborative care at the recommendation of his primary care provider. At this time, the patient is not interested in pursuing medication. Given this preference, traditional psychotherapy may be the most appropriate long-term fit; however, we will proceed with a psychiatric consultation to further evaluate his needs and clarify the most suitable plan of care.   A follow-up visit is planned to discuss next steps and determine whether referral to a traditional therapist will best meet his ongoing grief-related needs.   Screenings PHQ-9 Assessments:     03/29/2024    9:43 AM 02/08/2024    2:23 PM 08/25/2023    7:59 AM  Depression screen PHQ 2/9  Decreased Interest 1 1 1   Down, Depressed, Hopeless 1 1 1   PHQ - 2 Score 2 2 2   Altered sleeping 1 3 1   Tired, decreased energy 1 1 1   Change in appetite 0 0 0  Feeling bad or failure about yourself  1 1 1   Trouble concentrating 1 1 0  Moving slowly or fidgety/restless 1 0 0  Suicidal thoughts 0 0 0  PHQ-9 Score 7 8  5    Difficult doing work/chores Somewhat difficult Somewhat difficult Somewhat difficult     Data saved with a previous flowsheet row definition   GAD-7 Assessments:     04/01/2024    8:22 AM 03/29/2024    9:43 AM 02/08/2024    2:23  PM 08/25/2023    8:00 AM  GAD 7 : Generalized Anxiety Score  Nervous, Anxious, on Edge 1 0 2 0  Control/stop worrying 1 1 1  0  Worry too much - different things 1 1 1  0  Trouble relaxing 1 1 2  0  Restless 1 1 1  0  Easily annoyed or irritable 1 1 1  0  Afraid - awful might happen 0 1 2 0  Total GAD 7 Score 6 6 10  0  Anxiety Difficulty Somewhat difficult Somewhat difficult Somewhat  difficult Not difficult at all    Past Medical History Past Medical History:  Diagnosis Date   Arthritis 2010   Hyperlipidemia    Hypertension 2017    Vital signs: There were no vitals filed for this visit.  Allergies:  Allergies as of 04/15/2024   (No Known Allergies)    Medication History Current medications:  Outpatient Encounter Medications as of 04/15/2024  Medication Sig   aspirin 81 MG EC tablet Take by mouth.   atorvastatin (LIPITOR) 20 MG tablet Take 20 mg by mouth daily.   losartan-hydrochlorothiazide (HYZAAR) 50-12.5 MG tablet Take 1 tablet by mouth daily.   metoprolol succinate (TOPROL-XL) 25 MG 24 hr tablet Take 25 mg by mouth daily.   sildenafil (VIAGRA) 100 MG tablet TAKE ONE TABLET BY MOUTH DAILY AS NEEDED FOR ERECTILE DYSFUNCTION.   No facility-administered encounter medications on file as of 04/15/2024.     Scribe for Treatment Team: Nicholas JINNY Brandt

## 2024-04-17 ENCOUNTER — Ambulatory Visit (HOSPITAL_COMMUNITY): Admitting: Clinical

## 2024-05-08 ENCOUNTER — Telehealth: Payer: Self-pay | Admitting: Orthopedic Surgery

## 2024-05-08 NOTE — Telephone Encounter (Signed)
 Dr. Onesimo pt - pt scheduled himself an appointment online for 05/10/24 that says:  find out about my surgerywhen I look at his chart, at his last visit, Dr. JAYSON said:  Return for After medical clearance for OR, do we have clearance?  Should he keep this appointment or do I need to cancel it?

## 2024-05-08 NOTE — Telephone Encounter (Signed)
 I have called the pt and advised and advised that I'm cancelling his appointment and he can reschedule once we have the clearance.  I left our fax # for him.

## 2024-05-08 NOTE — Telephone Encounter (Signed)
 Thank you for noticing this. Please call pt and ask him to contact his PCP and Cardiologist to send us  back his clearance. Coming in for an appointment isn't necessary until we get clearance.

## 2024-05-10 ENCOUNTER — Ambulatory Visit: Admitting: Orthopedic Surgery

## 2024-05-16 ENCOUNTER — Ambulatory Visit (HOSPITAL_COMMUNITY): Admitting: Clinical

## 2024-05-28 ENCOUNTER — Telehealth: Payer: Self-pay | Admitting: Orthopedic Surgery

## 2024-05-28 NOTE — Telephone Encounter (Signed)
 Dr. Onesimo pt - pt lvm inquiring about surgery, he stated that he hasn't heard anything.  [phone]

## 2024-06-05 NOTE — Telephone Encounter (Signed)
 Called to let pt know we have not received letter back from PCP.

## 2024-07-09 ENCOUNTER — Ambulatory Visit: Admitting: Family Medicine

## 2024-07-09 ENCOUNTER — Ambulatory Visit: Payer: PPO
